# Patient Record
Sex: Female | Born: 1960 | Hispanic: No | Marital: Single | Smoking: Never smoker
Health system: Southern US, Community
[De-identification: ages and names within clinical notes are randomized; demographics above are authoritative.]

## PROBLEM LIST (undated history)

## (undated) DIAGNOSIS — F32A Depression, unspecified: Secondary | ICD-10-CM

## (undated) DIAGNOSIS — J4520 Mild intermittent asthma, uncomplicated: Secondary | ICD-10-CM

## (undated) DIAGNOSIS — F419 Anxiety disorder, unspecified: Secondary | ICD-10-CM

## (undated) DIAGNOSIS — E785 Hyperlipidemia, unspecified: Secondary | ICD-10-CM

## (undated) DIAGNOSIS — F329 Major depressive disorder, single episode, unspecified: Secondary | ICD-10-CM

## (undated) HISTORY — PX: DILATION AND CURETTAGE OF UTERUS: SHX78

## (undated) HISTORY — DX: Mild intermittent asthma, uncomplicated: J45.20

## (undated) HISTORY — DX: Hyperlipidemia, unspecified: E78.5

---

## 1990-11-18 HISTORY — PX: CHOLECYSTECTOMY: SHX55

## 1998-11-18 HISTORY — PX: ECTOPIC PREGNANCY SURGERY: SHX613

## 2003-07-20 ENCOUNTER — Other Ambulatory Visit: Admission: RE | Admit: 2003-07-20 | Discharge: 2003-07-20 | Payer: Self-pay | Admitting: *Deleted

## 2013-02-22 ENCOUNTER — Emergency Department (HOSPITAL_BASED_OUTPATIENT_CLINIC_OR_DEPARTMENT_OTHER)
Admission: EM | Admit: 2013-02-22 | Discharge: 2013-02-22 | Disposition: A | Discharge status: Home / Self Care | Payer: Medicaid Other | Attending: Emergency Medicine | Admitting: Emergency Medicine

## 2013-02-22 ENCOUNTER — Emergency Department (HOSPITAL_BASED_OUTPATIENT_CLINIC_OR_DEPARTMENT_OTHER): Payer: Medicaid Other

## 2013-02-22 ENCOUNTER — Encounter (HOSPITAL_BASED_OUTPATIENT_CLINIC_OR_DEPARTMENT_OTHER): Payer: Self-pay | Admitting: *Deleted

## 2013-02-22 DIAGNOSIS — S61452A Open bite of left hand, initial encounter: Secondary | ICD-10-CM

## 2013-02-22 DIAGNOSIS — Z8659 Personal history of other mental and behavioral disorders: Secondary | ICD-10-CM | POA: Insufficient documentation

## 2013-02-22 DIAGNOSIS — S61409A Unspecified open wound of unspecified hand, initial encounter: Secondary | ICD-10-CM | POA: Insufficient documentation

## 2013-02-22 DIAGNOSIS — S0003XA Contusion of scalp, initial encounter: Secondary | ICD-10-CM | POA: Insufficient documentation

## 2013-02-22 HISTORY — DX: Depression, unspecified: F32.A

## 2013-02-22 HISTORY — DX: Anxiety disorder, unspecified: F41.9

## 2013-02-22 HISTORY — DX: Major depressive disorder, single episode, unspecified: F32.9

## 2013-02-22 LAB — CBC WITH DIFFERENTIAL/PLATELET
Basophils Absolute: 0 10*3/uL (ref 0.0–0.1)
Eosinophils Absolute: 0.2 10*3/uL (ref 0.0–0.7)
Eosinophils Relative: 2 % (ref 0–5)
MCH: 31.6 pg (ref 26.0–34.0)
MCHC: 34.2 g/dL (ref 30.0–36.0)
MCV: 92.5 fL (ref 78.0–100.0)
Platelets: 316 10*3/uL (ref 150–400)
RDW: 13.5 % (ref 11.5–15.5)

## 2013-02-22 MED ORDER — OXYCODONE-ACETAMINOPHEN 5-325 MG PO TABS
ORAL_TABLET | ORAL | Status: AC
  Administered 2013-02-22: 1 via ORAL
  Filled 2013-02-22: qty 1

## 2013-02-22 MED ORDER — AMPICILLIN-SULBACTAM SODIUM 3 (2-1) G IJ SOLR
3.0000 g | Freq: Once | INTRAMUSCULAR | Status: AC
  Administered 2013-02-22: 3 g via INTRAVENOUS
  Filled 2013-02-22: qty 3

## 2013-02-22 MED ORDER — ACETAMINOPHEN 325 MG PO TABS
650.0000 mg | ORAL_TABLET | Freq: Once | ORAL | Status: AC
  Administered 2013-02-22: 650 mg via ORAL
  Filled 2013-02-22: qty 2

## 2013-02-22 MED ORDER — AMOXICILLIN-POT CLAVULANATE 875-125 MG PO TABS: 1.0000 | ORAL_TABLET | Freq: Two times a day (BID) | ORAL | Status: DC

## 2013-02-22 MED ORDER — OXYCODONE-ACETAMINOPHEN 5-325 MG PO TABS
1.0000 | ORAL_TABLET | Freq: Once | ORAL | Status: AC
  Filled 2013-02-22: qty 1

## 2013-02-22 MED ORDER — OXYCODONE-ACETAMINOPHEN 5-325 MG PO TABS: 1.0000 | ORAL_TABLET | Freq: Four times a day (QID) | ORAL | Status: DC | PRN

## 2013-02-22 MED ORDER — CLINDAMYCIN PHOSPHATE 600 MG/50ML IV SOLN
600.0000 mg | Freq: Once | INTRAVENOUS | Status: AC
  Administered 2013-02-22: 600 mg via INTRAVENOUS
  Filled 2013-02-22: qty 50
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
## 2013-02-22 NOTE — ED Notes (Signed)
Patient transported to & from xray. 

## 2013-04-24 DIAGNOSIS — K219 Gastro-esophageal reflux disease without esophagitis: Secondary | ICD-10-CM | POA: Insufficient documentation

## 2013-04-24 DIAGNOSIS — F332 Major depressive disorder, recurrent severe without psychotic features: Secondary | ICD-10-CM | POA: Insufficient documentation

## 2014-05-19 DIAGNOSIS — G43909 Migraine, unspecified, not intractable, without status migrainosus: Secondary | ICD-10-CM | POA: Insufficient documentation

## 2014-06-06 ENCOUNTER — Emergency Department (HOSPITAL_BASED_OUTPATIENT_CLINIC_OR_DEPARTMENT_OTHER)
Admission: EM | Admit: 2014-06-06 | Discharge: 2014-06-07 | Disposition: A | Discharge status: Home / Self Care | Payer: Medicaid Other | Attending: Emergency Medicine | Admitting: Emergency Medicine

## 2014-06-06 ENCOUNTER — Encounter (HOSPITAL_BASED_OUTPATIENT_CLINIC_OR_DEPARTMENT_OTHER): Payer: Self-pay | Admitting: Emergency Medicine

## 2014-06-06 DIAGNOSIS — T7804XA Anaphylactic reaction due to fruits and vegetables, initial encounter: Secondary | ICD-10-CM | POA: Insufficient documentation

## 2014-06-06 DIAGNOSIS — R079 Chest pain, unspecified: Secondary | ICD-10-CM | POA: Insufficient documentation

## 2014-06-06 DIAGNOSIS — R221 Localized swelling, mass and lump, neck: Secondary | ICD-10-CM

## 2014-06-06 DIAGNOSIS — Z792 Long term (current) use of antibiotics: Secondary | ICD-10-CM | POA: Insufficient documentation

## 2014-06-06 DIAGNOSIS — R42 Dizziness and giddiness: Secondary | ICD-10-CM | POA: Insufficient documentation

## 2014-06-06 DIAGNOSIS — R220 Localized swelling, mass and lump, head: Secondary | ICD-10-CM | POA: Insufficient documentation

## 2014-06-06 DIAGNOSIS — T782XXA Anaphylactic shock, unspecified, initial encounter: Secondary | ICD-10-CM

## 2014-06-06 DIAGNOSIS — T621X1A Toxic effect of ingested berries, accidental (unintentional), initial encounter: Secondary | ICD-10-CM | POA: Insufficient documentation

## 2014-06-06 DIAGNOSIS — Y9289 Other specified places as the place of occurrence of the external cause: Secondary | ICD-10-CM | POA: Insufficient documentation

## 2014-06-06 DIAGNOSIS — L299 Pruritus, unspecified: Secondary | ICD-10-CM | POA: Insufficient documentation

## 2014-06-06 DIAGNOSIS — F411 Generalized anxiety disorder: Secondary | ICD-10-CM | POA: Insufficient documentation

## 2014-06-06 DIAGNOSIS — Y9389 Activity, other specified: Secondary | ICD-10-CM | POA: Insufficient documentation

## 2014-06-06 DIAGNOSIS — L500 Allergic urticaria: Secondary | ICD-10-CM | POA: Insufficient documentation

## 2014-06-06 DIAGNOSIS — T7840XA Allergy, unspecified, initial encounter: Secondary | ICD-10-CM

## 2014-06-06 DIAGNOSIS — T622X1A Toxic effect of other ingested (parts of) plant(s), accidental (unintentional), initial encounter: Secondary | ICD-10-CM | POA: Insufficient documentation

## 2014-06-06 MED ORDER — FAMOTIDINE IN NACL 20-0.9 MG/50ML-% IV SOLN
INTRAVENOUS | Status: AC
  Administered 2014-06-06: 20 mg
  Filled 2014-06-06: qty 50

## 2014-06-06 MED ORDER — DIPHENHYDRAMINE HCL 25 MG PO TABS: 50.0000 mg | ORAL_TABLET | Freq: Four times a day (QID) | ORAL | Status: DC

## 2014-06-06 MED ORDER — PREDNISONE 50 MG PO TABS: 50.0000 mg | ORAL_TABLET | Freq: Every day | ORAL | Status: DC

## 2014-06-06 MED ORDER — METHYLPREDNISOLONE SODIUM SUCC 125 MG IJ SOLR
INTRAMUSCULAR | Status: AC
  Administered 2014-06-06: 125 mg
  Filled 2014-06-06: qty 2

## 2014-06-06 MED ORDER — SODIUM CHLORIDE 0.9 % IV BOLUS (SEPSIS)
1000.0000 mL | Freq: Once | INTRAVENOUS | Status: AC
  Administered 2014-06-06: 1000 mL via INTRAVENOUS

## 2014-06-06 MED ORDER — DIPHENHYDRAMINE HCL 50 MG/ML IJ SOLN
INTRAMUSCULAR | Status: AC
  Administered 2014-06-06: 25 mg
  Filled 2014-06-06: qty 1

## 2014-06-06 MED ORDER — EPINEPHRINE HCL 1 MG/ML IJ SOLN
INTRAMUSCULAR | Status: AC
  Administered 2014-06-06: 0.3 mg
  Filled 2014-06-06: qty 1

## 2014-06-06 NOTE — ED Notes (Signed)
Was eating cherries and felt swelling inside her mouth and then her throat felt like it was swelling. Swelling to her uvula.

## 2014-06-06 NOTE — ED Notes (Signed)
Famotidine Pepcid is infusing at present time.

## 2014-06-06 NOTE — Discharge Instructions (Signed)
Return to the ED with any concerns including difficulty breathing, lip or tongue swelling, vomiting, fainting, decreased level of alertness/lethargy, or any other alarming symptoms °

## 2014-06-06 NOTE — ED Provider Notes (Signed)
CSN: [id number]     Arrival date & time 06/06/14  2149 History  This chart was scribed for Robin Chick, MD by Nicholos Johns, ED scribe. This patient was seen in room MH10/MH10 and the patient's care was started at 9:58 PM.    Chief Complaint  Patient presents with  . Allergic Reaction   Patient is a 53 y.o. female presenting with allergic reaction. The history is provided by the patient. No language interpreter was used.  Allergic Reaction Presenting symptoms: itching and swelling   Presenting symptoms: no difficulty breathing and no difficulty swallowing   Itching:    Location:  Lips, arm and chest   Severity:  Moderate   Onset quality:  Gradual   Duration: 30 minutes.   Timing:  Constant   Progression:  Worsening Swelling:    Location:  Mouth   Onset quality:  Gradual   Duration: 30 minutes.   Timing:  Constant   Progression:  Worsening   Chronicity:  New Severity:  Moderate Prior allergic episodes:  No prior episodes Context: food   Relieved by:  Nothing Worsened by:  Nothing tried Ineffective treatments:  None tried  HPI Comments: Robin Trevino is a 53 y.o. female who presents to the Emergency Department for possible allergic reaction; onset 30 minutes ago. Denies trouble breathing. States she was eating cherries and felt swelling inside her mouth and throat within 5-10 minutes after eating. Has eaten cherries before with no reaction. Also reports itching inside her lips and along her upper chest and arms. Currently notes some substernal chest pain and dizziness. Has not taken any OTC medication to treat. No known allergy to cherries.   Past Medical History  Diagnosis Date  . Anxiety   . Depression    History reviewed. No pertinent past surgical history. No family history on file. History  Substance Use Topics  . Smoking status: Never Smoker   . Smokeless tobacco: Not on file  . Alcohol Use: Yes     Comment: occ   OB History   Grav Para Term Preterm  Abortions TAB SAB Ect Mult Living                 Review of Systems  HENT: Negative for trouble swallowing.   Respiratory: Negative for shortness of breath.   Cardiovascular: Positive for chest pain.  Skin: Positive for itching.  Neurological: Positive for dizziness.  All other systems reviewed and are negative.  Allergies  Donnatal  Home Medications   Prior to Admission medications   Medication Sig Start Date End Date Taking? Authorizing Provider  amoxicillin-clavulanate (AUGMENTIN) 875-125 MG per tablet Take 1 tablet by mouth 2 (two) times daily. 02/22/13   Juliet Rude. Pickering, MD  oxyCODONE-acetaminophen (PERCOCET/ROXICET) 5-325 MG per tablet Take 1-2 tablets by mouth every 6 (six) hours as needed for pain. 02/22/13   Juliet Rude. Rubin Payor, MD   Triage vitals: BP 119/83  Pulse 101  Temp(Src) 98.6 F (37 C) (Oral)  Resp 20  Ht 5\' 4"  (1.626 m)  Wt 133 lb (60.328 kg)  BMI 22.82 kg/m2  SpO2 98%  LMP 06/01/2014  Physical Exam  Nursing note and vitals reviewed. Constitutional: She is oriented to person, place, and time. She appears well-developed and well-nourished. No distress.  Anxious appearing  HENT:  Head: Normocephalic and atraumatic.  Mouth/Throat: Uvula swelling present. Posterior oropharyngeal erythema present.  Mild lip swelling. No tongue swelling.   Eyes: Conjunctivae and EOM are normal.  Neck: Neck supple. No  tracheal deviation present.  Cardiovascular: Normal rate.   Pulmonary/Chest: Effort normal. No respiratory distress.  No SOB.  Musculoskeletal: Normal range of motion.  Neurological: She is alert and oriented to person, place, and time.  Skin: Skin is warm and dry.  Hives on upper chest.  Psychiatric: She has a normal mood and affect. Her behavior is normal.    ED Course  Procedures (including critical care time) DIAGNOSTIC STUDIES: Oxygen Saturation is 98% on room air, normal by my interpretation.    COORDINATION OF CARE: At 10:02 PM: Discussed  treatment plan with patient which includes IV fluids to reverse sxs and 4-6 hour observation. Patient agrees.    CRITICAL CARE Performed by: Robin ChickLINKER,MARTHA K Total critical care time: 35 Critical care time was exclusive of separately billable procedures and treating other patients. Critical care was necessary to treat or prevent imminent or life-threatening deterioration. Critical care was time spent personally by me on the following activities: development of treatment plan with patient and/or surrogate as well as nursing, discussions with consultants, evaluation of patient's response to treatment, examination of patient, obtaining history from patient or surrogate, ordering and performing treatments and interventions, ordering and review of laboratory studies, ordering and review of radiographic studies, pulse oximetry and re-evaluation of patient's condition.  10:53 PM pt rechecked and is feeling much improved. Has had complete resolution of her symptoms.   Labs Review Labs Reviewed - No data to display  Imaging Review No results found.   EKG Interpretation None      MDM   Final diagnoses:  None  pt rpesenting with lip swelling, throat swelling and tongue itching with some shortness of breath- presents approx 30 minutes after eating cherries.  Pt treated with epinephrine, benadryl, solumedrol, pepcid.  She has had complete resolution of her allergic symptoms.  Pt will need to be observed x 4 hours until 2am.  Care signed out to Dr. Read DriversMolpus at end of shift.  Will d/c with epipen, to take benadryl for the next 2-3 days and complete steroid course.    I personally performed the services described in this documentation, which was scribed in my presence. The recorded information has been reviewed and is accurate.   1:45AM Vitals stable. No sign of anaphylaxis; will D/C home.   Hanley SeamenJohn L Kenwood Rosiak, MD 06/07/14 [phone]

## 2014-06-07 NOTE — ED Notes (Signed)
Pt. Resting with eyes closed.  Pt. Has no s/s of resp. Distress.   Pt. Has no drooling noted and no trouble swallowing.

## 2015-02-16 DIAGNOSIS — E785 Hyperlipidemia, unspecified: Secondary | ICD-10-CM | POA: Insufficient documentation

## 2015-08-29 ENCOUNTER — Encounter: Payer: Self-pay | Admitting: Pediatrics

## 2015-08-29 ENCOUNTER — Ambulatory Visit (INDEPENDENT_AMBULATORY_CARE_PROVIDER_SITE_OTHER): Payer: BLUE CROSS/BLUE SHIELD | Admitting: Pediatrics

## 2015-08-29 VITALS — BP 124/76 | HR 80 | Temp 98.1°F | Resp 16 | Ht 65.0 in | Wt 141.0 lb

## 2015-08-29 DIAGNOSIS — J4520 Mild intermittent asthma, uncomplicated: Secondary | ICD-10-CM

## 2015-08-29 DIAGNOSIS — T7800XA Anaphylactic reaction due to unspecified food, initial encounter: Secondary | ICD-10-CM | POA: Insufficient documentation

## 2015-08-29 DIAGNOSIS — J301 Allergic rhinitis due to pollen: Secondary | ICD-10-CM

## 2015-08-29 DIAGNOSIS — T781XXA Other adverse food reactions, not elsewhere classified, initial encounter: Secondary | ICD-10-CM | POA: Diagnosis not present

## 2015-08-29 HISTORY — DX: Mild intermittent asthma, uncomplicated: J45.20

## 2015-08-29 MED ORDER — EPINEPHRINE 0.3 MG/0.3ML IJ SOAJ: 0.3000 mg | Freq: Once | INTRAMUSCULAR | Status: AC

## 2015-08-29 MED ORDER — ALBUTEROL SULFATE 108 (90 BASE) MCG/ACT IN AEPB: 2.0000 | INHALATION_SPRAY | Freq: Four times a day (QID) | RESPIRATORY_TRACT | Status: AC

## 2015-08-29 NOTE — Patient Instructions (Signed)
Environmental control of dust mite. She will avoid pears, apples, cherries, pineapples ,almond and hazelnut. If she has an allergic reaction she will take Benadryl-diphenhydramine 50 mg every 4 hours. If she has life-threatening symptoms she will inject herself with EpiPen. She was given a list of foods associated with the oral allergy syndrome for information. Cetirizine 10 mg once a day if needed for runny nose or itching. Nasacort 2 sprays per nostril once a day if needed for stuffy nose. Pro-air 2 puffs every 4 hours if needed for cough or wheeze. She may use pro-air 2 puffs before exercise. Follow-up with her allergic symptoms are not under control. If she has more allergic reaction she will let us know.

## 2015-08-29 NOTE — Progress Notes (Signed)
NEW PATIENT NOTE  RE: Robin Trevino MRN: [id number] DOB: Feb 09, 1961 [facility] [place] [address] [place] [address] Date of Office Visit: 08/29/2015  Assessment Referring provider: Delilah Shan, MD [address] [facility] [place], [place] [address]  Chief Complaint: Food Intolerance   HPI In August of this year, the patient ate an Asian pear and developed an itching swollen throat and was seen in the emergency room.. They kept her overnight to treat this reaction. Last year she had similar symptoms after eating cherries. She has a history of allergic rhinitis since childhood aggravated by exposure to dust ,cigarette smoke, cats and the spring time. She has coughing spells in the springtime. She has some coughing and shortness of breath with exercise. She has had itching of her mouth from apples, pineapple and almond.    Review of Systems  Constitutional: Negative.   HENT: Positive for congestion (in the spring and fall of the year).   Eyes: Negative.   Respiratory: Positive for wheezing (occasionally with exercise).   Cardiovascular: Negative.   Gastrointestinal: Negative.   Genitourinary: Negative.   Musculoskeletal: Negative.   Skin: Negative.   Neurological: Negative.   Endo/Heme/Allergies: Positive for environmental allergies.  Psychiatric/Behavioral: Negative.      Drug Allergies:  Allergies  Allergen Reactions  . Amoxicillin Diarrhea  . Donnatal [Belladonna Alk-Phenobarb Er]   . Lamotrigine   . Pb-Hyoscy-Atropine-Scopol Er     Physical Exam: BP 124/76 mmHg  Pulse 80  Temp(Src) 98.1 F (36.7 C) (Oral)  Resp 16  Ht _0  (1.651 m)  Wt 141 lb (63.957 kg)  BMI 23.46 kg/m2  Physical Exam  Constitutional: She is oriented to person, place, and time. She appears well-developed and well-nourished.  HENT:  Eyes normal. Ears normal. Nose mild swelling of the nasal  turbinates. Pharynx normal  Neck: Neck supple. No thyromegaly present.  Cardiovascular:  Heart S1-S2 normal, no murmurs.  Pulmonary/Chest:  Chest clear to percussion and auscultation  Abdominal:  Abdomen normal. No organ enlargement  Lymphadenopathy:    She has no cervical adenopathy.  Neurological: She is alert and oriented to person, place, and time.  Skin:  Clear     Diagnostics:  Allergy skin tests were very positive to grass pollen, weed pollen, tree pollen, dust mite, Apple, almond and hazelnut. Forced vital capacity 2.93 L FEV1 2.58 L. Predicted forced hot capacity 3.45 L predicted FEV1 2.77 L. After albuterol 2 puffs the forced vital capacity became 2.99 L and the FEV1 2.69 L-the spirometry is in the normal range and there was no significant improvement after albuterol    Assessment and Plan: 1. Allergy with anaphylaxis due to food, initial encounter   2. Allergic rhinitis due to pollen   3. Mild intermittent asthma, uncomplicated   4. Oral allergy syndrome, initial encounter   5. Seasonal allergic rhinitis due to pollen    Meds ordered this encounter  Medications  . Albuterol Sulfate (PROAIR RESPICLICK) 453 (90 BASE) MCG/ACT AEPB    Sig: Inhale 2 puffs into the lungs every 6 (six) hours.    Dispense:  1 each    Refill:  0  . EPINEPHrine (EPIPEN 2-PAK) 0.3 mg/0.3 mL IJ SOAJ injection    Sig: Inject 0.3 mLs (0.3 mg total) into the muscle once.    Dispense:  2 Device    Refill:  1   Patient Instructions  Environmental control  of dust mite. She will avoid pears, apples, cherries, pineapples ,almond and hazelnut. If she has an allergic reaction she will take Benadryl-diphenhydramine 50 mg every 4 hours. If she has life-threatening symptoms she will inject herself with EpiPen. She was given a list of foods associated with the oral allergy syndrome for information. Cetirizine 10 mg once a day if needed for runny nose or itching. Nasacort 2 sprays per nostril once a day  if needed for stuffy nose. Pro-air 2 puffs every 4 hours if needed for cough or wheeze. She may use pro-air 2 puffs before exercise. Follow-up with her allergic symptoms are not under control. If she has more allergic reaction she will let us know.   Return if symptoms worsen or fail to improve.   Thank you for the opportunity to care for this patient.  Please do not hesitate to contact me with questions.  [facility] [address] [place], [place] [address] [phone]  J. Charyl Bigger, M.D.

## 2015-08-30 DIAGNOSIS — T781XXA Other adverse food reactions, not elsewhere classified, initial encounter: Secondary | ICD-10-CM | POA: Insufficient documentation

## 2016-09-16 ENCOUNTER — Encounter (HOSPITAL_BASED_OUTPATIENT_CLINIC_OR_DEPARTMENT_OTHER): Payer: Self-pay | Admitting: *Deleted

## 2016-09-16 ENCOUNTER — Emergency Department (HOSPITAL_BASED_OUTPATIENT_CLINIC_OR_DEPARTMENT_OTHER)
Admission: EM | Admit: 2016-09-16 | Discharge: 2016-09-17 | Disposition: A | Discharge status: Home / Self Care | Payer: Medicaid Other | Attending: Emergency Medicine | Admitting: Emergency Medicine

## 2016-09-16 DIAGNOSIS — R1013 Epigastric pain: Secondary | ICD-10-CM | POA: Insufficient documentation

## 2016-09-16 DIAGNOSIS — J4520 Mild intermittent asthma, uncomplicated: Secondary | ICD-10-CM | POA: Insufficient documentation

## 2016-09-16 DIAGNOSIS — Z7951 Long term (current) use of inhaled steroids: Secondary | ICD-10-CM | POA: Insufficient documentation

## 2016-09-16 MED ORDER — ONDANSETRON HCL 4 MG/2ML IJ SOLN
4.0000 mg | Freq: Once | INTRAMUSCULAR | Status: DC
  Filled 2016-09-16: qty 2

## 2016-09-16 MED ORDER — FENTANYL CITRATE (PF) 100 MCG/2ML IJ SOLN
100.0000 ug | Freq: Once | INTRAMUSCULAR | Status: DC
Start: 2016-09-17 — End: 2016-09-17
  Filled 2016-09-16: qty 2

## 2016-09-16 MED ORDER — SODIUM CHLORIDE 0.9 % IV SOLN: Freq: Once | INTRAVENOUS | Status: DC

## 2016-09-16 NOTE — ED Notes (Signed)
MD at bedside. 

## 2016-09-16 NOTE — ED Provider Notes (Addendum)
[facility] Provider Note: Robin Spurling, MD, FACEP  CSN: [id number] MRN: [id number] ARRIVAL: 09/16/16 at [place]: MH10/MH10  By signing my name below, I, Georgette Shell, attest that this documentation has been prepared under the direction and in the presence of Shanon Rosser, MD. Electronically Signed: Georgette Shell, ED Scribe. 09/16/16. 11:53 PM.  CHIEF COMPLAINT  Abdominal Pain   HISTORY OF PRESENT ILLNESS  HPI Comments: Robin Trevino is a 55 y.o. female who presents to the Emergency Department complaining of epigastric abdominal pain radiating to her left shoulder and back onset 2 hours ago while eating. Pt rates her current pain a 8/10. Pt also has associated nausea but no vomiting. Pain is exacerbated with deep breathing or palpation. Pt has taken Pepto Bismol with no relief. Pt further notes that she has had diarrhea for the past couple of days. Pt denies fever or blood in her stool.  Past Medical History:  Diagnosis Date  . Anxiety   . Depression   . Mild intermittent asthma 08/29/2015    Past Surgical History:  Procedure Laterality Date  . CESAREAN SECTION  1996  . CHOLECYSTECTOMY Bilateral 1992  . [place]  . ECTOPIC PREGNANCY SURGERY  2000    Family History  Problem Relation Age of Onset  . Asthma Son   . Asthma Mother   . Allergic rhinitis Mother   . Asthma Brother     Social History  Substance Use Topics  . Smoking status: Never Smoker  . Smokeless tobacco: Not on file  . Alcohol use Yes     Comment: occ    Prior to Admission medications   Medication Sig Start Date End Date Taking? Authorizing Provider  Albuterol Sulfate (PROAIR RESPICLICK) 294 (90 BASE) MCG/ACT AEPB Inhale 2 puffs into the lungs every 6 (six) hours. 08/29/15   Charlies Silvers, MD  BIOTIN PO Take 500 mg by mouth daily.    Historical Provider, MD  diphenhydrAMINE (BENADRYL) 25 MG tablet Take 2 tablets (50 mg total) by mouth every 6 (six)  hours. Take 1-2 tablets every 6 hours x 2 days, then space out to an as needed basis 06/06/14   Alfonzo Beers, MD  EPINEPHrine (EPIPEN 2-PAK) 0.3 mg/0.3 mL IJ SOAJ injection Inject 0.3 mLs (0.3 mg total) into the muscle once. 08/29/15   Charlies Silvers, MD  ferrous sulfate 325 (65 FE) MG tablet Take 325 mg by mouth daily.    Historical Provider, MD  omeprazole (PRILOSEC) 20 MG capsule Take one capsule daily at least 30 minutes before first dose of Carafate. 09/17/16   Brick Ketcher, MD  sucralfate (CARAFATE) 1 g tablet Take 1 tablet (1 g total) by mouth 4 (four) times daily -  with meals and at bedtime. 09/17/16   Brailynn Breth, MD  triamcinolone (NASACORT) 55 MCG/ACT AERO nasal inhaler Place 1 spray into the nose as needed.    Historical Provider, MD  valACYclovir (VALTREX) 500 MG tablet Take 500 mg by mouth as needed. 11/27/11   Historical Provider, MD    Allergies Amoxicillin; Donnatal [belladonna alk-phenobarb er]; Lamotrigine; and Pb-hyoscy-atropine-scopol er   REVIEW OF SYSTEMS  Negative except as noted here or in the History of Present Illness.   PHYSICAL EXAMINATION  Initial Vital Signs Blood pressure 132/91, pulse 72, temperature 97.9 F (36.6 C), resp. rate 16, height 5' 4"  (1.626 m), weight 140 lb (63.5 kg), last menstrual period 06/01/2014, SpO2 98 %.  Examination General: Well-developed,  well-nourished female in no acute distress; appearance consistent with age of record HENT: normocephalic; atraumatic Eyes: pupils equal, round and reactive to light; extraocular muscles intact Neck: supple Heart: regular rate and rhythm Lungs: clear to auscultation bilaterally; frequent dry cough  Abdomen: soft; nondistended; epigastric tenderness; no masses or hepatosplenomegaly; bowel sounds present Extremities: No deformity; full range of motion Neurologic: Awake, alert and oriented; motor function intact in all extremities and symmetric; no facial droop Skin: Warm and dry Psychiatric:  Normal mood and affect   RESULTS  Summary of this visit's results, reviewed by myself:   EKG Interpretation  Date/Time:  Monday September 16 2016 23:50:00 EDT Ventricular Rate:  84 PR Interval:    QRS Duration: 101 QT Interval:  374 QTC Calculation: 443 R Axis:   75 Text Interpretation:  Sinus rhythm Normal ECG No previous ECGs available Confirmed by Jisel Fleet  MD, Jenny Reichmann ([id number]) on 09/16/2016 11:54:55 PM      Laboratory Studies: Results for orders placed or performed during the hospital encounter of 09/16/16 (from the past 24 hour(s))  CBC with Differential/Platelet     Status: Abnormal   Collection Time: 09/17/16 12:05 AM  Result Value Ref Range   WBC 6.2 4.0 - 10.5 K/uL   RBC 3.65 (L) 3.87 - 5.11 MIL/uL   Hemoglobin 11.6 (L) 12.0 - 15.0 g/dL   HCT 35.2 (L) 36.0 - 46.0 %   MCV 96.4 78.0 - 100.0 fL   MCH 31.8 26.0 - 34.0 pg   MCHC 33.0 30.0 - 36.0 g/dL   RDW 12.5 11.5 - 15.5 %   Platelets 286 150 - 400 K/uL   Neutrophils Relative % 45 %   Neutro Abs 2.8 1.7 - 7.7 K/uL   Lymphocytes Relative 41 %   Lymphs Abs 2.5 0.7 - 4.0 K/uL   Monocytes Relative 11 %   Monocytes Absolute 0.7 0.1 - 1.0 K/uL   Eosinophils Relative 3 %   Eosinophils Absolute 0.2 0.0 - 0.7 K/uL   Basophils Relative 0 %   Basophils Absolute 0.0 0.0 - 0.1 K/uL  Comprehensive metabolic panel     Status: Abnormal   Collection Time: 09/17/16 12:05 AM  Result Value Ref Range   Sodium 136 135 - 145 mmol/L   Potassium 3.9 3.5 - 5.1 mmol/L   Chloride 106 101 - 111 mmol/L   CO2 26 22 - 32 mmol/L   Glucose, Bld 103 (H) 65 - 99 mg/dL   BUN 19 6 - 20 mg/dL   Creatinine, Ser 0.69 0.44 - 1.00 mg/dL   Calcium 8.8 (L) 8.9 - 10.3 mg/dL   Total Protein 7.1 6.5 - 8.1 g/dL   Albumin 3.6 3.5 - 5.0 g/dL   AST 19 15 - 41 U/L   ALT 14 14 - 54 U/L   Alkaline Phosphatase 66 38 - 126 U/L   Total Bilirubin 0.3 0.3 - 1.2 mg/dL   GFR calc non Af Amer >60 >60 mL/min   GFR calc Af Amer >60 >60 mL/min   Anion gap 4 (L) 5 - 15    Lipase, blood     Status: None   Collection Time: 09/17/16 12:05 AM  Result Value Ref Range   Lipase 29 11 - 51 U/L   Imaging Studies: No results found.  ED COURSE  Nursing notes and initial vitals signs, including pulse oximetry, reviewed.  Vitals:   09/16/16 2342 09/16/16 2343 09/17/16 0052  BP:  132/91 118/79  Pulse:  72 69  Resp:  16  16  Temp:  97.9 F (36.6 C)   SpO2:  98% 98%  Weight: 140 lb (63.5 kg)    Height: 5' 4"  (1.626 m)     12:14 AM Patient is refusing an IV and refuses CT scan. She does consent to blood draw.  12:54 AM Pain down to a 4 out of 10 after Carafate. Suspect gastritis and we'll treat with omeprazole and Carafate.  PROCEDURES    ED DIAGNOSES     ICD-9-CM ICD-10-CM   1. Epigastric pain 789.06 R10.13     I personally performed the services described in this documentation, which was scribed in my presence. The recorded information has been reviewed and is accurate.     Shanon Rosser, MD 09/17/16 4255    Shanon Rosser, MD 09/17/16 0100

## 2016-09-16 NOTE — ED Triage Notes (Signed)
Pt c/o epigastric pain which radiates to left  shoulder and back  X 2 hrs

## 2016-09-17 LAB — CBC WITH DIFFERENTIAL/PLATELET
Basophils Absolute: 0 10*3/uL (ref 0.0–0.1)
Basophils Relative: 0 %
EOS ABS: 0.2 10*3/uL (ref 0.0–0.7)
EOS PCT: 3 %
HCT: 35.2 % — ABNORMAL LOW (ref 36.0–46.0)
Hemoglobin: 11.6 g/dL — ABNORMAL LOW (ref 12.0–15.0)
LYMPHS ABS: 2.5 10*3/uL (ref 0.7–4.0)
Lymphocytes Relative: 41 %
MCH: 31.8 pg (ref 26.0–34.0)
MCHC: 33 g/dL (ref 30.0–36.0)
MCV: 96.4 fL (ref 78.0–100.0)
Monocytes Absolute: 0.7 10*3/uL (ref 0.1–1.0)
Monocytes Relative: 11 %
Neutro Abs: 2.8 10*3/uL (ref 1.7–7.7)
Neutrophils Relative %: 45 %
PLATELETS: 286 10*3/uL (ref 150–400)
RBC: 3.65 MIL/uL — AB (ref 3.87–5.11)
RDW: 12.5 % (ref 11.5–15.5)
WBC: 6.2 10*3/uL (ref 4.0–10.5)

## 2016-09-17 LAB — COMPREHENSIVE METABOLIC PANEL
ALT: 14 U/L (ref 14–54)
ANION GAP: 4 — AB (ref 5–15)
AST: 19 U/L (ref 15–41)
Albumin: 3.6 g/dL (ref 3.5–5.0)
Alkaline Phosphatase: 66 U/L (ref 38–126)
BUN: 19 mg/dL (ref 6–20)
CHLORIDE: 106 mmol/L (ref 101–111)
CO2: 26 mmol/L (ref 22–32)
Calcium: 8.8 mg/dL — ABNORMAL LOW (ref 8.9–10.3)
Creatinine, Ser: 0.69 mg/dL (ref 0.44–1.00)
GFR calc non Af Amer: >60 mL/min (ref >60)
Glucose, Bld: 103 mg/dL — ABNORMAL HIGH (ref 65–99)
Potassium: 3.9 mmol/L (ref 3.5–5.1)
SODIUM: 136 mmol/L (ref 135–145)
Total Bilirubin: 0.3 mg/dL (ref 0.3–1.2)
Total Protein: 7.1 g/dL (ref 6.5–8.1)

## 2016-09-17 LAB — LIPASE, BLOOD: Lipase: 29 U/L (ref 11–51)

## 2016-09-17 MED ORDER — SUCRALFATE 1 G PO TABS
1.0000 g | ORAL_TABLET | Freq: Once | ORAL | Status: AC
  Administered 2016-09-17: 1 g via ORAL
  Filled 2016-09-17: qty 1

## 2016-09-17 MED ORDER — SUCRALFATE 1 G PO TABS: 1.0000 g | ORAL_TABLET | Freq: Three times a day (TID) | ORAL | 0 refills | Status: DC

## 2016-09-17 MED ORDER — OMEPRAZOLE 20 MG PO CPDR: DELAYED_RELEASE_CAPSULE | ORAL | 0 refills | Status: DC

## 2016-09-17 NOTE — ED Notes (Signed)
Pt has refused to have an IV, stating that it is too expensive.  MD notified.  Pt allowed blood draw.  Sts she will not have a CT scan if it is ordered by the edp but will follow up with her pmd tomorrow.

## 2017-01-22 LAB — HM HEPATITIS C SCREENING LAB: HM Hepatitis Screen: NEGATIVE

## 2017-02-11 DIAGNOSIS — K439 Ventral hernia without obstruction or gangrene: Secondary | ICD-10-CM | POA: Insufficient documentation

## 2018-03-05 LAB — HIV ANTIBODY (ROUTINE TESTING W REFLEX): HIV 1&2 Ab, 4th Generation: NONREACTIVE

## 2019-09-17 LAB — HM PAP SMEAR: HM Pap smear: NORMAL

## 2020-02-05 ENCOUNTER — Emergency Department (HOSPITAL_BASED_OUTPATIENT_CLINIC_OR_DEPARTMENT_OTHER): Payer: Self-pay

## 2020-02-05 ENCOUNTER — Other Ambulatory Visit: Payer: Self-pay

## 2020-02-05 ENCOUNTER — Emergency Department (HOSPITAL_BASED_OUTPATIENT_CLINIC_OR_DEPARTMENT_OTHER)
Admission: EM | Admit: 2020-02-05 | Discharge: 2020-02-05 | Disposition: A | Discharge status: Home / Self Care | Payer: Self-pay | Attending: Emergency Medicine | Admitting: Emergency Medicine

## 2020-02-05 ENCOUNTER — Encounter (HOSPITAL_BASED_OUTPATIENT_CLINIC_OR_DEPARTMENT_OTHER): Payer: Self-pay

## 2020-02-05 DIAGNOSIS — A084 Viral intestinal infection, unspecified: Secondary | ICD-10-CM | POA: Insufficient documentation

## 2020-02-05 DIAGNOSIS — R1033 Periumbilical pain: Secondary | ICD-10-CM | POA: Insufficient documentation

## 2020-02-05 DIAGNOSIS — Z20822 Contact with and (suspected) exposure to covid-19: Secondary | ICD-10-CM | POA: Insufficient documentation

## 2020-02-05 DIAGNOSIS — R197 Diarrhea, unspecified: Secondary | ICD-10-CM | POA: Insufficient documentation

## 2020-02-05 DIAGNOSIS — R112 Nausea with vomiting, unspecified: Secondary | ICD-10-CM | POA: Insufficient documentation

## 2020-02-05 LAB — LIPASE, BLOOD: Lipase: 22 U/L (ref 11–51)

## 2020-02-05 LAB — CBC WITH DIFFERENTIAL/PLATELET
Abs Immature Granulocytes: 0.01 10*3/uL (ref 0.00–0.07)
Basophils Absolute: 0 10*3/uL (ref 0.0–0.1)
Basophils Relative: 0 %
Eosinophils Absolute: 0.1 10*3/uL (ref 0.0–0.5)
Eosinophils Relative: 1 %
HCT: 38.8 % (ref 36.0–46.0)
Hemoglobin: 13.4 g/dL (ref 12.0–15.0)
Immature Granulocytes: 0 %
Lymphocytes Relative: 7 %
Lymphs Abs: 0.5 10*3/uL — ABNORMAL LOW (ref 0.7–4.0)
MCH: 32.9 pg (ref 26.0–34.0)
MCHC: 34.5 g/dL (ref 30.0–36.0)
MCV: 95.3 fL (ref 80.0–100.0)
Monocytes Absolute: 0.4 10*3/uL (ref 0.1–1.0)
Monocytes Relative: 5 %
Neutro Abs: 6 10*3/uL (ref 1.7–7.7)
Neutrophils Relative %: 87 %
Platelets: 257 10*3/uL (ref 150–400)
RBC: 4.07 MIL/uL (ref 3.87–5.11)
RDW: 13.8 % (ref 11.5–15.5)
WBC: 6.9 10*3/uL (ref 4.0–10.5)
nRBC: 0 % (ref 0.0–0.2)

## 2020-02-05 LAB — URINALYSIS, ROUTINE W REFLEX MICROSCOPIC
Bilirubin Urine: NEGATIVE
Glucose, UA: NEGATIVE mg/dL
Hgb urine dipstick: NEGATIVE
Ketones, ur: NEGATIVE mg/dL
Leukocytes,Ua: NEGATIVE
Nitrite: NEGATIVE
Protein, ur: NEGATIVE mg/dL
Specific Gravity, Urine: 1.01 (ref 1.005–1.030)
pH: 5.5 (ref 5.0–8.0)

## 2020-02-05 LAB — COMPREHENSIVE METABOLIC PANEL
ALT: 19 U/L (ref 0–44)
AST: 28 U/L (ref 15–41)
Albumin: 4 g/dL (ref 3.5–5.0)
Alkaline Phosphatase: 89 U/L (ref 38–126)
Anion gap: 9 (ref 5–15)
BUN: 19 mg/dL (ref 6–20)
CO2: 22 mmol/L (ref 22–32)
Calcium: 8.6 mg/dL — ABNORMAL LOW (ref 8.9–10.3)
Chloride: 105 mmol/L (ref 98–111)
Creatinine, Ser: 0.81 mg/dL (ref 0.44–1.00)
GFR calc Af Amer: >60 mL/min (ref >60)
GFR calc non Af Amer: >60 mL/min (ref >60)
Glucose, Bld: 117 mg/dL — ABNORMAL HIGH (ref 70–99)
Potassium: 3.5 mmol/L (ref 3.5–5.1)
Sodium: 136 mmol/L (ref 135–145)
Total Bilirubin: 0.7 mg/dL (ref 0.3–1.2)
Total Protein: 7.7 g/dL (ref 6.5–8.1)

## 2020-02-05 LAB — SARS CORONAVIRUS 2 AG (30 MIN TAT): SARS Coronavirus 2 Ag: NEGATIVE

## 2020-02-05 LAB — LACTIC ACID, PLASMA: Lactic Acid, Venous: 1.9 mmol/L (ref 0.5–1.9)

## 2020-02-05 MED ORDER — ONDANSETRON HCL 4 MG/2ML IJ SOLN
4.0000 mg | Freq: Once | INTRAMUSCULAR | Status: AC
  Administered 2020-02-05: 4 mg via INTRAVENOUS
  Filled 2020-02-05: qty 2

## 2020-02-05 MED ORDER — METRONIDAZOLE IN NACL 5-0.79 MG/ML-% IV SOLN
500.0000 mg | Freq: Once | INTRAVENOUS | Status: AC
  Administered 2020-02-05: 500 mg via INTRAVENOUS
  Filled 2020-02-05: qty 100

## 2020-02-05 MED ORDER — IOHEXOL 300 MG/ML  SOLN
100.0000 mL | Freq: Once | INTRAMUSCULAR | Status: AC | PRN
  Administered 2020-02-05: 20:00:00 100 mL via INTRAVENOUS

## 2020-02-05 MED ORDER — SODIUM CHLORIDE 0.9 % IV SOLN
2.0000 g | Freq: Once | INTRAVENOUS | Status: AC
  Administered 2020-02-05: 2 g via INTRAVENOUS
  Filled 2020-02-05: qty 20

## 2020-02-05 MED ORDER — ACETAMINOPHEN 325 MG PO TABS
650.0000 mg | ORAL_TABLET | Freq: Once | ORAL | Status: AC
  Administered 2020-02-05: 650 mg via ORAL
  Filled 2020-02-05: qty 2

## 2020-02-05 MED ORDER — DICYCLOMINE HCL 20 MG PO TABS: 20.0000 mg | ORAL_TABLET | Freq: Two times a day (BID) | ORAL | 0 refills | Status: DC

## 2020-02-05 MED ORDER — DICYCLOMINE HCL 10 MG PO CAPS
10.0000 mg | ORAL_CAPSULE | Freq: Once | ORAL | Status: AC
  Administered 2020-02-05: 10 mg via ORAL
  Filled 2020-02-05: qty 1

## 2020-02-05 MED ORDER — ONDANSETRON 4 MG PO TBDP: 4.0000 mg | ORAL_TABLET | Freq: Three times a day (TID) | ORAL | 0 refills | Status: DC | PRN

## 2020-02-05 MED ORDER — MORPHINE SULFATE (PF) 4 MG/ML IV SOLN
4.0000 mg | Freq: Once | INTRAVENOUS | Status: AC
  Administered 2020-02-05: 4 mg via INTRAVENOUS
  Filled 2020-02-05: qty 1

## 2020-02-05 MED ORDER — SODIUM CHLORIDE 0.9 % IV BOLUS
1000.0000 mL | Freq: Once | INTRAVENOUS | Status: AC
  Administered 2020-02-05: 1000 mL via INTRAVENOUS

## 2020-02-05 NOTE — ED Notes (Signed)
ED Provider at bedside. 

## 2020-02-05 NOTE — ED Provider Notes (Signed)
[facility] Provider Note   CSN: [id number] Arrival date & time: 02/05/20  1752     History Chief Complaint  Patient presents with  . Abdominal Pain    Robin Trevino is a 59 y.o. female with PMHx anxiety and depression who presents to the ED today complaining of sudden onset, constant, waxing and waning, pulling, periumbilical abdominal pain that began this morning around 5-6 AM. Pt also complains of nausea, nonbloody nonbilious emesis, and diarrhea. She reports about 3-4 episodes of each. No BRBPR or melena. Pt reports she began having chills throughout the day however was unaware she had a fever. On arrival to the ED pt's temp 103 F. She does mention that she went drinking last night - reports about 5-6 shots of vodka and tequila. Pt states that it was a celebration and she does not normally drink that heavy. No recent abx use. PSHx includes cholecystectomy and 2 cesarean sections. No cough, shortness of breath, urinary symptoms, pelvic pain, or any other associated symptoms.   The history is provided by the patient and medical records.       Past Medical History:  Diagnosis Date  . Anxiety   . Depression   . Mild intermittent asthma 08/29/2015    Patient Active Problem List   Diagnosis Date Noted  . 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 Allergic rhinitis due to pollen 08/29/2015  . Mild intermittent asthma 08/29/2015    Past Surgical History:  Procedure Laterality Date  . CESAREAN SECTION  1996  . CHOLECYSTECTOMY Bilateral 1992  . [place]  . ECTOPIC PREGNANCY SURGERY  2000     OB History   No obstetric history on file.     Family History  Problem Relation Age of Onset  . Asthma Son   . Asthma Mother   . Allergic rhinitis Mother   . Asthma Brother     Social History   Tobacco Use  . Smoking status: Never Smoker  . Smokeless tobacco:  Never Used  Substance Use Topics  . Alcohol use: Yes    Comment: occ  . Drug use: No    Home Medications Prior to Admission medications   Medication Sig Start Date End Date Taking? Authorizing Provider  Albuterol Sulfate (PROAIR RESPICLICK) 408 (90 BASE) MCG/ACT AEPB Inhale 2 puffs into the lungs every 6 (six) hours. 08/29/15   Charlies Silvers, MD  BIOTIN PO Take 500 mg by mouth daily.    [provider]  dicyclomine (BENTYL) 20 MG tablet Take 1 tablet (20 mg total) by mouth 2 (two) times daily. 02/05/20   Alroy Bailiff, Ivin Rosenbloom, PA-C  diphenhydrAMINE (BENADRYL) 25 MG tablet Take 2 tablets (50 mg total) by mouth every 6 (six) hours. Take 1-2 tablets every 6 hours x 2 days, then space out to an as needed basis 06/06/14   Mabe, Forbes Cellar, MD  EPINEPHrine (EPIPEN 2-PAK) 0.3 mg/0.3 mL IJ SOAJ injection Inject 0.3 mLs (0.3 mg total) into the muscle once. 08/29/15   Charlies Silvers, MD  ferrous sulfate 325 (65 FE) MG tablet Take 325 mg by mouth daily.    [provider]  omeprazole (PRILOSEC) 20 MG capsule Take one capsule daily at least 30 minutes before first dose of Carafate. 09/17/16   Molpus, John, MD  ondansetron (ZOFRAN ODT) 4 MG disintegrating tablet Take 1 tablet (4 mg total) by mouth every 8 (eight)  hours as needed for nausea or vomiting. 02/05/20   Alroy Bailiff, Latondra Gebhart, PA-C  sucralfate (CARAFATE) 1 g tablet Take 1 tablet (1 g total) by mouth 4 (four) times daily -  with meals and at bedtime. 09/17/16   Molpus, John, MD  triamcinolone (NASACORT) 55 MCG/ACT AERO nasal inhaler Place 1 spray into the nose as needed.    [provider]  valACYclovir (VALTREX) 500 MG tablet Take 500 mg by mouth as needed. 11/27/11   [provider]    Allergies    Amoxicillin, Donnatal [belladonna alk-phenobarb er], Lamotrigine, and Phenobarbital-belladonna alk  Review of Systems   Review of Systems  Constitutional: Positive for chills and fever.  Respiratory: Negative for cough and  shortness of breath.   Cardiovascular: Negative for chest pain.  Gastrointestinal: Positive for abdominal pain, diarrhea, nausea and vomiting.  Genitourinary: Negative for dysuria, flank pain and frequency.  Musculoskeletal: Negative for myalgias.  All other systems reviewed and are negative.   Physical Exam Updated Vital Signs BP 131/88 (BP Location: Right Arm)   Pulse (!) 121   Temp (!) 103 F (39.4 C) (Oral)   Resp (!) 24   Ht _0  (1.626 m)   Wt 63.5 kg   LMP 06/01/2014   SpO2 98%   BMI 24.03 kg/m   Physical Exam Vitals and nursing note reviewed.  Constitutional:      Appearance: She is ill-appearing. She is not diaphoretic.  HENT:     Head: Normocephalic and atraumatic.  Eyes:     Conjunctiva/sclera: Conjunctivae normal.  Cardiovascular:     Rate and Rhythm: Regular rhythm. Tachycardia present.  Pulmonary:     Effort: Pulmonary effort is normal.     Breath sounds: Normal breath sounds. No wheezing, rhonchi or rales.  Abdominal:     Palpations: Abdomen is soft.     Tenderness: There is abdominal tenderness in the right lower quadrant and periumbilical area. There is guarding (voluntary). There is no right CVA tenderness, left CVA tenderness or rebound. Negative signs include Rovsing's sign, psoas sign and obturator sign.  Musculoskeletal:     Cervical back: Neck supple.  Skin:    General: Skin is warm and dry.  Neurological:     Mental Status: She is alert.     ED Results / Procedures / Treatments   Labs (all labs ordered are listed, but only abnormal results are displayed) Labs Reviewed  COMPREHENSIVE METABOLIC PANEL - Abnormal; Notable for the following components:      Result Value   Glucose, Bld 117 (*)    Calcium 8.6 (*)    All other components within normal limits  CBC WITH DIFFERENTIAL/PLATELET - Abnormal; Notable for the following components:   Lymphs Abs 0.5 (*)    All other components within normal limits  SARS CORONAVIRUS 2 AG (30 MIN TAT)   CULTURE, BLOOD (ROUTINE X 2)  CULTURE, BLOOD (ROUTINE X 2)  URINE CULTURE  URINALYSIS, ROUTINE W REFLEX MICROSCOPIC  LACTIC ACID, PLASMA  LIPASE, BLOOD    EKG EKG Interpretation  Date/Time:  Saturday February 05 2020 18:15:30 EDT Ventricular Rate:  113 PR Interval:    QRS Duration: 98 QT Interval:  341 QTC Calculation: 468 R Axis:   90 Text Interpretation: Sinus tachycardia Consider right atrial enlargement Borderline right axis deviation Baseline wander in lead(s) V5 No significant change since last tracing Confirmed by Isla Pence [phone]) on 02/05/2020 6:20:02 PM   Radiology CT Abdomen Pelvis W Contrast  Result Date: 02/05/2020 CLINICAL  DATA:  Right lower quadrant pain EXAM: CT ABDOMEN AND PELVIS WITH CONTRAST TECHNIQUE: Multidetector CT imaging of the abdomen and pelvis was performed using the standard protocol following bolus administration of intravenous contrast. CONTRAST:  136m OMNIPAQUE IOHEXOL 300 MG/ML  SOLN COMPARISON:  01/19/2018 FINDINGS: Lower chest: No acute abnormality. Hepatobiliary: No focal liver abnormality is seen. Status post cholecystectomy. No biliary dilatation. Pancreas: Unremarkable. Spleen: Unremarkable. Adrenals/Urinary Tract: Decrease in size of small left upper pole renal cyst. Bladder is unremarkable. Adrenals are unremarkable. Stomach/Bowel: Stomach is within normal limits. Bowel is normal in caliber. Appendix is not visualized. Vascular/Lymphatic: No significant vascular findings are present. No enlarged abdominal or pelvic lymph nodes. Reproductive: Uterus is unremarkable. Increase in size of somewhat tubular and septated appearing low attenuation left adnexal structure. Other: No abdominal wall hernia or abnormality. No abdominopelvic ascites. Musculoskeletal: No acute or significant osseous findings. IMPRESSION: No findings to account for reported symptoms. Possible mild left hydrosalpinx. Electronically Signed   By: PMacy MisM.D.   On: 02/05/2020  20:03    Procedures Procedures (including critical care time)  Medications Ordered in ED Medications  sodium chloride 0.9 % bolus 1,000 mL (0 mLs Intravenous Stopped 02/05/20 2046)  acetaminophen (TYLENOL) tablet 650 mg (650 mg Oral Given 02/05/20 1828)  cefTRIAXone (ROCEPHIN) 2 g in sodium chloride 0.9 % 100 mL IVPB (0 g Intravenous Stopped 02/05/20 1915)  metroNIDAZOLE (FLAGYL) IVPB 500 mg (0 mg Intravenous Stopped 02/05/20 2046)  ondansetron (ZOFRAN) injection 4 mg (4 mg Intravenous Given 02/05/20 1833)  morphine 4 MG/ML injection 4 mg (4 mg Intravenous Given 02/05/20 1835)  iohexol (OMNIPAQUE) 300 MG/ML solution 100 mL (100 mLs Intravenous Contrast Given 02/05/20 1940)    ED Course  I have reviewed the triage vital signs and the nursing notes.  Pertinent labs & imaging results that were available during my care of the patient were reviewed by me and considered in my medical decision making (see chart for details).  59year old female who presents to the ED today complaining of sudden onset severe periumbilical abdominal pain, nausea, vomiting, diarrhea this morning.  She did drink heavily last night.  On arrival to the ED patient is febrile at 103, tachycardic at 121, tachypneic at 24.  She appears uncomfortable on exam.  She has tenderness in the periumbilical and right lower quadrant regions.  She has voluntary guarding.  Will obtain CT scan at this time.  There is concern for possible appendicitis however given patient's history of alcohol last night she may very well have viral gastroenteritis.  Patient does currently meet SEPSIS criteria given vitals.  Will empirically start on intra-abdominal antibiotics.  Blood cultures and labs obtained.  Will also swab for Covid.   CBC without leukocytosis.  Hemoglobin stable at 13.4. CMP with glucose 117 and calcium 8.6. No other electrolyte abnormalities.  Lipase 22.  Lactic acid within normal limits at 1.9.   Rapid COVID negative.   CT scan  without acute findings. Does show questionable hydrosalpinx on left however no tenderness in this area. Do not feel this is cause of pt's symptoms today.   On reeval patient resting more comfortably. Heart rate in the low 100's now. Will fluid challenge at this time and reevaluate. If able to tolerate PO pt to be discharged home with symptomatic treatment. Pt declines PCR COVID test at this time with the understanding that we cannot rule it out with this test. She denies risk of exposure.  Pt able to tolerate fluids without difficulty.  Pt to be discharged home at this time.     Discharge Instructions     Please pick up medications and take as prescribed to help with nausea and abdominal cramping  Drink plenty of fluids to stay hydrated  Take Tylenol as needed for fevers  Follow up with your PCP regarding your ED visit Return to the ED IMMEDIATELY for any worsening symptoms including worsening pain, excessive vomiting despite anti nausea medications, dizziness/lightheadedness, diarrhea that lasts > 10 days       MDM Rules/Calculators/A&P                       Final Clinical Impression(s) / ED Diagnoses Final diagnoses:  Periumbilical abdominal pain  Nausea vomiting and diarrhea  Viral gastroenteritis    Rx / DC Orders ED Discharge Orders         Ordered    dicyclomine (BENTYL) 20 MG tablet  2 times daily     02/05/20 2123    ondansetron (ZOFRAN ODT) 4 MG disintegrating tablet  Every 8 hours PRN     02/05/20 2123           Discharge Instructions     Please pick up medications and take as prescribed to help with nausea and abdominal cramping  Drink plenty of fluids to stay hydrated  Take Tylenol as needed for fevers  Follow up with your PCP regarding your ED visit Return to the ED IMMEDIATELY for any worsening symptoms including worsening pain, excessive vomiting despite anti nausea medications, dizziness/lightheadedness, diarrhea that lasts > 10 days        Eustaquio Maize, Hershal Coria 02/05/20 2153    Isla Pence, MD 02/05/20 2157

## 2020-02-05 NOTE — ED Triage Notes (Signed)
Pt arrives to ED with reports of severe abdominal pain starting around 4-5 this morning. Pt has had NVD c/o RLQ pain.

## 2020-02-05 NOTE — Discharge Instructions (Signed)
Please pick up medications and take as prescribed to help with nausea and abdominal cramping  Drink plenty of fluids to stay hydrated  Take Tylenol as needed for fevers  Follow up with your PCP regarding your ED visit Return to the ED IMMEDIATELY for any worsening symptoms including worsening pain, excessive vomiting despite anti nausea medications, dizziness/lightheadedness, diarrhea that lasts > 10 days

## 2020-02-05 NOTE — ED Notes (Addendum)
Pt given gingerale and cup of ice per PO challenge. Pt states will call out if N/V/D occur

## 2020-02-05 NOTE — ED Notes (Signed)
Patient transported to CT 

## 2020-02-07 LAB — URINE CULTURE: Culture: NO GROWTH

## 2020-02-10 LAB — CULTURE, BLOOD (ROUTINE X 2)
Culture: NO GROWTH
Culture: NO GROWTH
Special Requests: ADEQUATE
Special Requests: ADEQUATE

## 2021-03-24 IMAGING — CT CT ABD-PELV W/ CM
2 of 5 series · 16 of 46 positions shown, 18 images · IV contrast (omnipaque)
Comparison: 01/19/2018

CLINICAL DATA: Right lower quadrant pain

EXAM:
CT ABDOMEN AND PELVIS WITH CONTRAST
TECHNIQUE: Multidetector CT imaging of the abdomen and pelvis was performed
using the standard protocol following bolus administration of
intravenous contrast.
CONTRAST:  100mL OMNIPAQUE IOHEXOL 300 MG/ML  SOLN

[Series 2: axial st · axial · 0.75mm/px · z∈[-257,+123]mm · 13 of 86 slices shown, 15 images]
[im 5/86  soft-tissue]
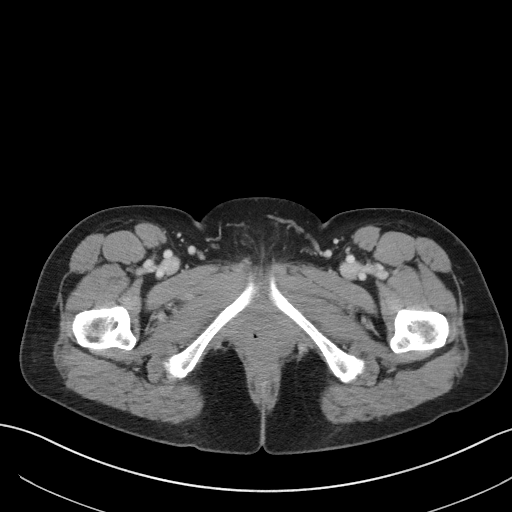
[im 5/86  bone]
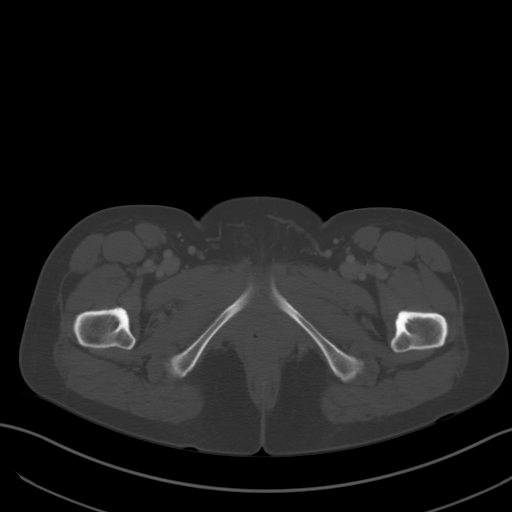
[im 13/86  soft-tissue]
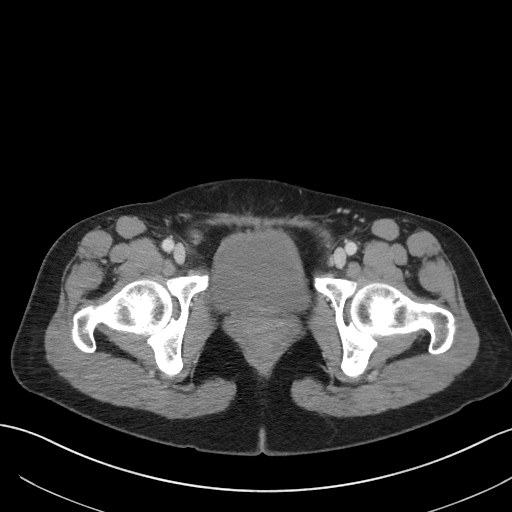
[im 18/86  soft-tissue]
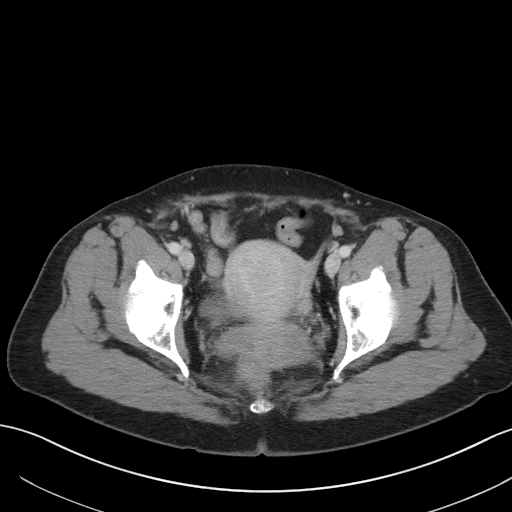
[im 26/86  soft-tissue]
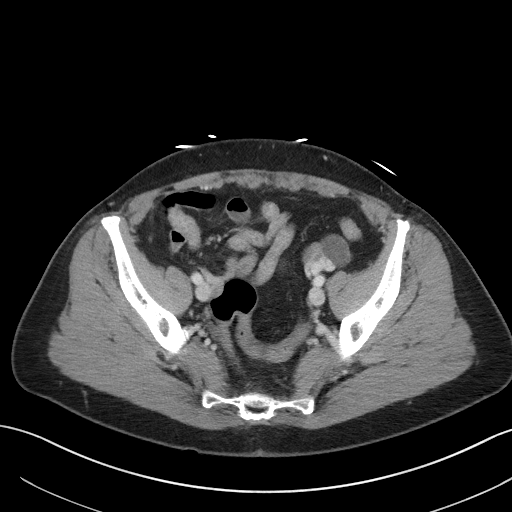
[im 30/86  soft-tissue]
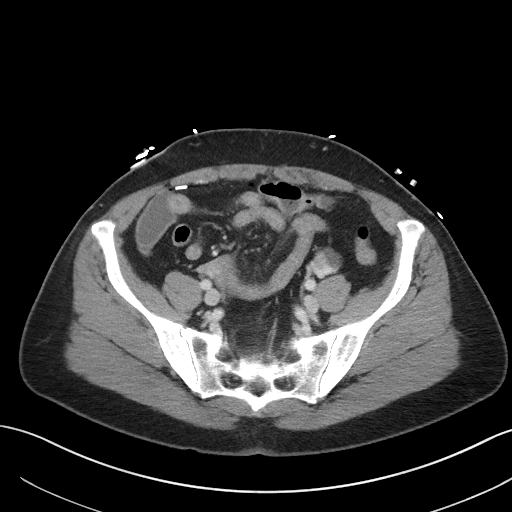
[im 39/86  soft-tissue]
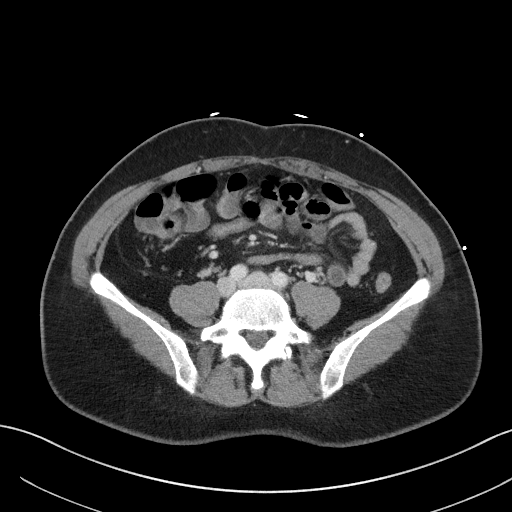
[im 43/86  soft-tissue]
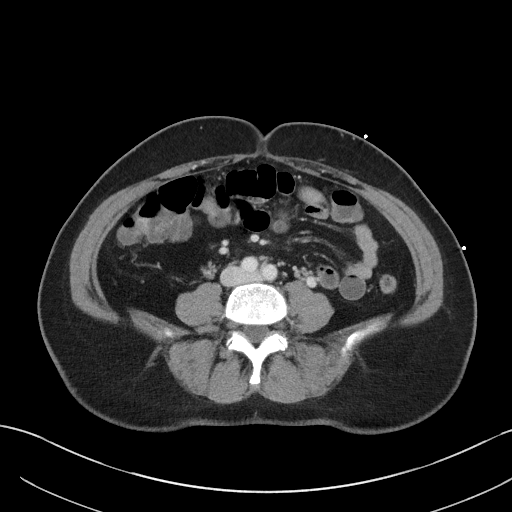
[im 47/86  soft-tissue]
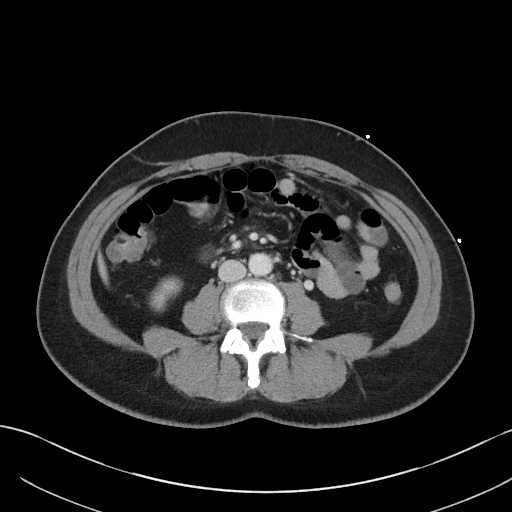
[im 56/86  soft-tissue]
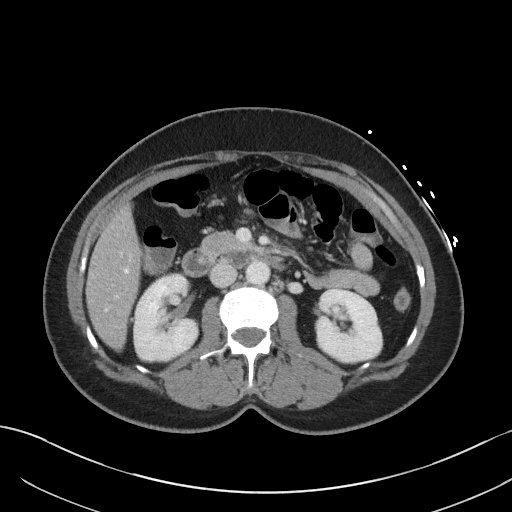
[im 56/86  bone]
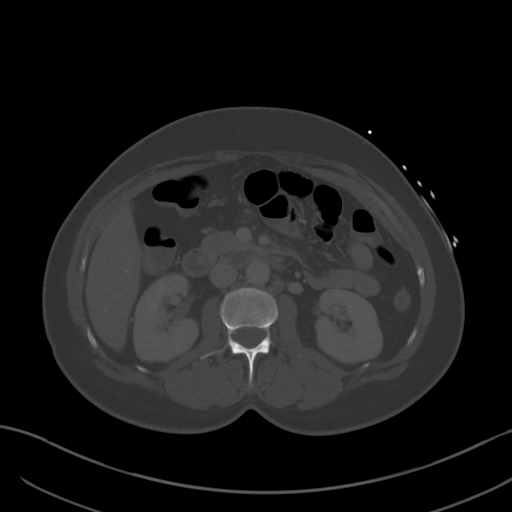
[im 60/86  soft-tissue]
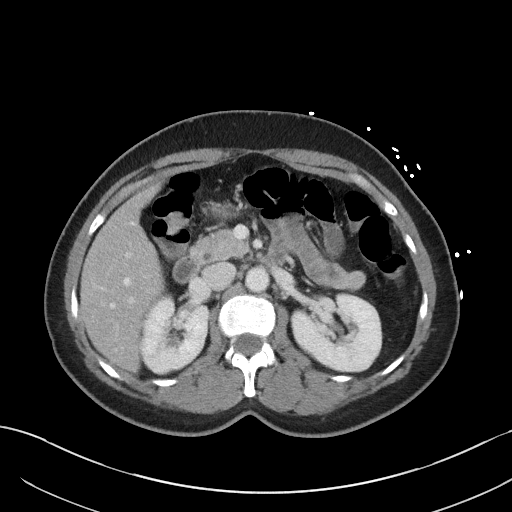
[im 69/86  soft-tissue]
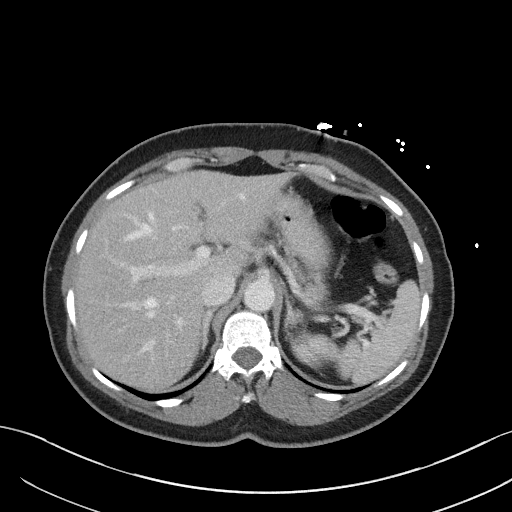
[im 73/86  soft-tissue]
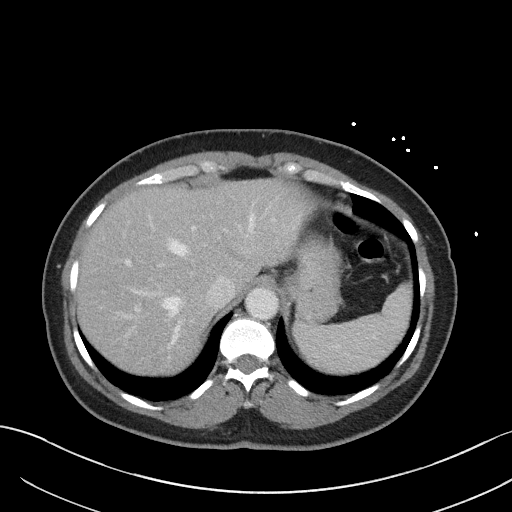
[im 81/86  soft-tissue]
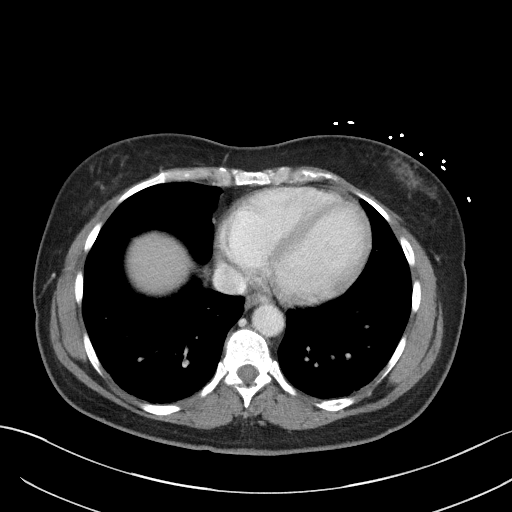

[Series 5: coronal st · coronal · 0.63mm/px · 3 of 83 slices shown]
[im 28/83  soft-tissue]
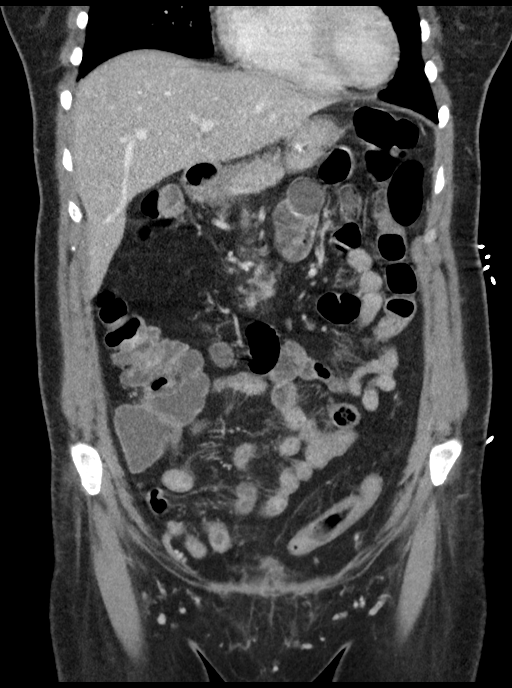
[im 37/83  soft-tissue]
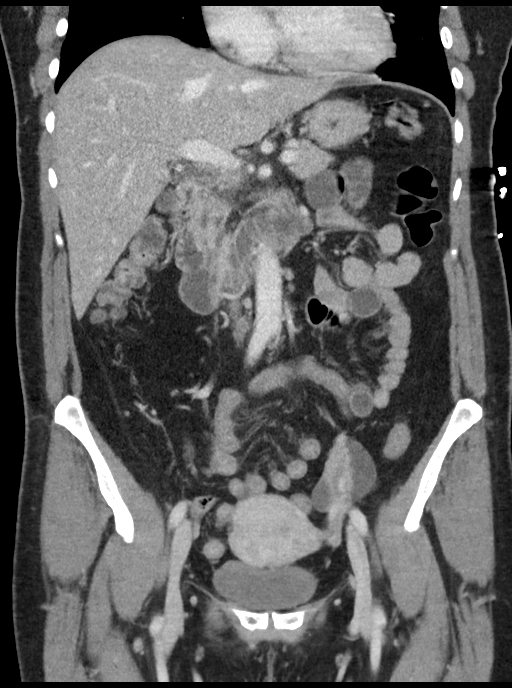
[im 46/83  soft-tissue]
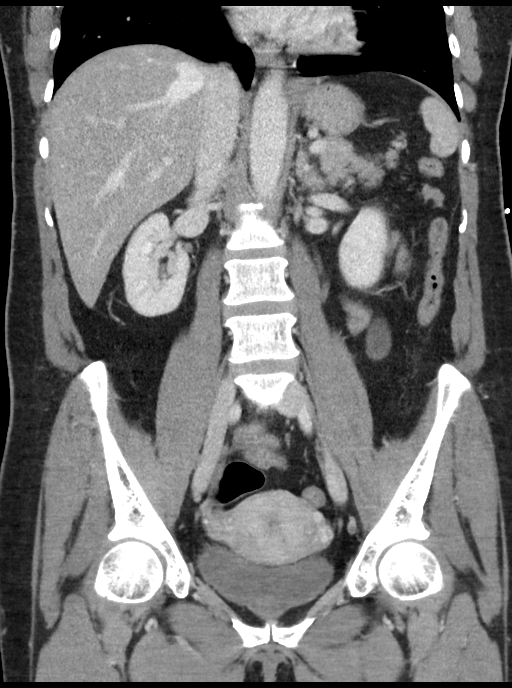

[16 of 46 positions shown; findings below may reference images not displayed]

FINDINGS: Lower chest: No acute abnormality.

Hepatobiliary: No focal liver abnormality is seen. Status post
cholecystectomy. No biliary dilatation.

Pancreas: Unremarkable.

Spleen: Unremarkable.

Adrenals/Urinary Tract: Decrease in size of small left upper pole
renal cyst. Bladder is unremarkable. Adrenals are unremarkable.

Stomach/Bowel: Stomach is within normal limits. Bowel is normal in
caliber. Appendix is not visualized.

Vascular/Lymphatic: No significant vascular findings are present. No
enlarged abdominal or pelvic lymph nodes.

Reproductive: Uterus is unremarkable. Increase in size of somewhat
tubular and septated appearing low attenuation left adnexal
structure.

Other: No abdominal wall hernia or abnormality. No abdominopelvic
ascites.

Musculoskeletal: No acute or significant osseous findings.
IMPRESSION: No findings to account for reported symptoms.

Possible mild left hydrosalpinx.

## 2021-09-08 ENCOUNTER — Encounter (HOSPITAL_BASED_OUTPATIENT_CLINIC_OR_DEPARTMENT_OTHER): Payer: Self-pay | Admitting: Emergency Medicine

## 2021-09-08 ENCOUNTER — Other Ambulatory Visit: Payer: Self-pay

## 2021-09-08 ENCOUNTER — Emergency Department (HOSPITAL_BASED_OUTPATIENT_CLINIC_OR_DEPARTMENT_OTHER): Payer: Self-pay

## 2021-09-08 ENCOUNTER — Emergency Department (HOSPITAL_BASED_OUTPATIENT_CLINIC_OR_DEPARTMENT_OTHER)
Admission: EM | Admit: 2021-09-08 | Discharge: 2021-09-09 | Disposition: A | Discharge status: Home / Self Care | Payer: Self-pay | Attending: Emergency Medicine | Admitting: Emergency Medicine

## 2021-09-08 DIAGNOSIS — F439 Reaction to severe stress, unspecified: Secondary | ICD-10-CM

## 2021-09-08 DIAGNOSIS — R000 Tachycardia, unspecified: Secondary | ICD-10-CM | POA: Insufficient documentation

## 2021-09-08 DIAGNOSIS — F419 Anxiety disorder, unspecified: Secondary | ICD-10-CM | POA: Insufficient documentation

## 2021-09-08 DIAGNOSIS — F430 Acute stress reaction: Secondary | ICD-10-CM | POA: Insufficient documentation

## 2021-09-08 DIAGNOSIS — R45851 Suicidal ideations: Secondary | ICD-10-CM | POA: Insufficient documentation

## 2021-09-08 DIAGNOSIS — J4520 Mild intermittent asthma, uncomplicated: Secondary | ICD-10-CM | POA: Insufficient documentation

## 2021-09-08 DIAGNOSIS — R251 Tremor, unspecified: Secondary | ICD-10-CM | POA: Insufficient documentation

## 2021-09-08 DIAGNOSIS — R0789 Other chest pain: Secondary | ICD-10-CM | POA: Insufficient documentation

## 2021-09-08 DIAGNOSIS — R0602 Shortness of breath: Secondary | ICD-10-CM | POA: Insufficient documentation

## 2021-09-08 DIAGNOSIS — R110 Nausea: Secondary | ICD-10-CM | POA: Insufficient documentation

## 2021-09-08 DIAGNOSIS — Z20822 Contact with and (suspected) exposure to covid-19: Secondary | ICD-10-CM | POA: Insufficient documentation

## 2021-09-08 DIAGNOSIS — R202 Paresthesia of skin: Secondary | ICD-10-CM | POA: Insufficient documentation

## 2021-09-08 DIAGNOSIS — R079 Chest pain, unspecified: Secondary | ICD-10-CM

## 2021-09-08 DIAGNOSIS — R413 Other amnesia: Secondary | ICD-10-CM | POA: Insufficient documentation

## 2021-09-08 DIAGNOSIS — Z7951 Long term (current) use of inhaled steroids: Secondary | ICD-10-CM | POA: Insufficient documentation

## 2021-09-08 DIAGNOSIS — R4182 Altered mental status, unspecified: Secondary | ICD-10-CM | POA: Insufficient documentation

## 2021-09-08 DIAGNOSIS — R519 Headache, unspecified: Secondary | ICD-10-CM | POA: Insufficient documentation

## 2021-09-08 LAB — COMPREHENSIVE METABOLIC PANEL
ALT: 22 U/L (ref 0–44)
AST: 29 U/L (ref 15–41)
Albumin: 4.5 g/dL (ref 3.5–5.0)
Alkaline Phosphatase: 158 U/L — ABNORMAL HIGH (ref 38–126)
Anion gap: 9 (ref 5–15)
BUN: 19 mg/dL (ref 6–20)
CO2: 22 mmol/L (ref 22–32)
Calcium: 9.3 mg/dL (ref 8.9–10.3)
Chloride: 107 mmol/L (ref 98–111)
Creatinine, Ser: 0.73 mg/dL (ref 0.44–1.00)
GFR, Estimated: >60 mL/min (ref >60)
Glucose, Bld: 104 mg/dL — ABNORMAL HIGH (ref 70–99)
Potassium: 3.7 mmol/L (ref 3.5–5.1)
Sodium: 138 mmol/L (ref 135–145)
Total Bilirubin: 0.4 mg/dL (ref 0.3–1.2)
Total Protein: 8.4 g/dL — ABNORMAL HIGH (ref 6.5–8.1)

## 2021-09-08 LAB — RESP PANEL BY RT-PCR (FLU A&B, COVID) ARPGX2
Influenza A by PCR: NEGATIVE
Influenza B by PCR: NEGATIVE
SARS Coronavirus 2 by RT PCR: NEGATIVE

## 2021-09-08 LAB — CBC WITH DIFFERENTIAL/PLATELET
Abs Immature Granulocytes: 0.01 10*3/uL (ref 0.00–0.07)
Basophils Absolute: 0 10*3/uL (ref 0.0–0.1)
Basophils Relative: 0 %
Eosinophils Absolute: 0.2 10*3/uL (ref 0.0–0.5)
Eosinophils Relative: 3 %
HCT: 37.6 % (ref 36.0–46.0)
Hemoglobin: 13.1 g/dL (ref 12.0–15.0)
Immature Granulocytes: 0 %
Lymphocytes Relative: 53 %
Lymphs Abs: 3.5 10*3/uL (ref 0.7–4.0)
MCH: 32 pg (ref 26.0–34.0)
MCHC: 34.8 g/dL (ref 30.0–36.0)
MCV: 91.7 fL (ref 80.0–100.0)
Monocytes Absolute: 0.6 10*3/uL (ref 0.1–1.0)
Monocytes Relative: 9 %
Neutro Abs: 2.3 10*3/uL (ref 1.7–7.7)
Neutrophils Relative %: 35 %
Platelets: 345 10*3/uL (ref 150–400)
RBC: 4.1 MIL/uL (ref 3.87–5.11)
RDW: 12.8 % (ref 11.5–15.5)
WBC: 6.6 10*3/uL (ref 4.0–10.5)
nRBC: 0 % (ref 0.0–0.2)

## 2021-09-08 LAB — D-DIMER, QUANTITATIVE: D-Dimer, Quant: 0.62 ug/mL-FEU — ABNORMAL HIGH (ref 0.00–0.50)

## 2021-09-08 LAB — TROPONIN I (HIGH SENSITIVITY): Troponin I (High Sensitivity): 2 ng/L (ref <18)

## 2021-09-08 MED ORDER — IOHEXOL 350 MG/ML SOLN
100.0000 mL | Freq: Once | INTRAVENOUS | Status: AC | PRN
  Administered 2021-09-08: 80 mL via INTRAVENOUS

## 2021-09-08 MED ORDER — DIPHENHYDRAMINE HCL 50 MG/ML IJ SOLN
50.0000 mg | Freq: Once | INTRAMUSCULAR | Status: AC
  Administered 2021-09-08: 50 mg via INTRAVENOUS
  Filled 2021-09-08: qty 1

## 2021-09-08 MED ORDER — LORAZEPAM 2 MG/ML IJ SOLN
0.5000 mg | Freq: Once | INTRAMUSCULAR | Status: AC
  Administered 2021-09-08: 0.5 mg via INTRAVENOUS
  Filled 2021-09-08: qty 1

## 2021-09-08 NOTE — ED Provider Notes (Signed)
[facility] EMERGENCY DEPARTMENT Provider Note   CSN: [id number] Arrival date & time: 09/08/21  2112     History Chief Complaint  Patient presents with   Chest Pain   Altered Mental Status    Robin Trevino is a 60 y.o. female.  HPI     60yo female with history of anxiety, depression, intermittent asthma who presents with concern for confusion for the last 3 days, chest pain, anxiety, headache.   She is tearful and anxious at time of evaluation.  Chest pain is tightness, centrally located, has some left side and right side as well. Not worse with deep breaths but does have shortness of breath or feels like can't get a deep breath.  Feels tight.  Nausea, no vomiting. No diarrhea, black or bloody stools.  No fevers.  No cough.  Had a recent flight, during which her headache started 2 weeks ago. Has had constant headache.  Had flashes/floaters and saw eye doctor and was diagnosed with?vitreus hemorrhage.  Woke up this morning with right arm tingling, then got better, then has been waxing and waning frequently today based on position. No other numbness, weakness, difficulty speaking, walking or change in vision.   Headache is severe, has been going on over last month. Anxiety and depression worse over last month. Has felt confused and not herself the last few days. Is crying "I don't know what is happening.."  Her sister died at the beginning of the month, and her son's father has a brain tumor. 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  Had her zoloft increased a few weeks ago from 15m to 517m since that time has felt jittery, anxious.  Sees psychiatrist and therapist. Has had some SI and had family remove guns from home.  Lives alone. Not having active SI at this time.   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     Family History  Problem Relation Age of Onset   Asthma Son    Asthma Mother    Allergic rhinitis Mother    Asthma Brother     Social History   Tobacco Use   Smoking status: Never   Smokeless tobacco: Never  Substance Use Topics   Alcohol use: Yes    Comment: occ   Drug use: No    Home Medications Prior to Admission medications   Medication Sig Start Date End Date Taking? Authorizing Provider  Albuterol Sulfate (PROAIR RESPICLICK) [id number] BASE) MCG/ACT AEPB Inhale 2 puffs into the lungs every 6 (six) hours. 08/29/15   BaCharlies SilversMD  BIOTIN PO Take 500 mg by mouth daily.    [provider]  dicyclomine (BENTYL) 20 MG tablet Take 1 tablet (20 mg total) by mouth 2 (two) times daily. 02/05/20   VeEustaquio MaizePA-C  diphenhydrAMINE (BENADRYL)  25 MG tablet Take 2 tablets (50 mg total) by mouth every 6 (six) hours. Take 1-2 tablets every 6 hours x 2 days, then space out to an as needed basis 06/06/14   Mabe, Forbes Cellar, MD  EPINEPHrine (EPIPEN 2-PAK) 0.3 mg/0.3 mL IJ SOAJ injection Inject 0.3 mLs (0.3 mg total) into the muscle once. 08/29/15   Charlies Silvers, MD  ferrous sulfate 325 (65 FE) MG tablet Take 325 mg by mouth daily.    [provider]  omeprazole (PRILOSEC) 20 MG capsule Take one capsule daily at least 30 minutes before first dose of Carafate. 09/17/16   Molpus, Jenny Reichmann, MD  ondansetron (ZOFRAN ODT) 4 MG disintegrating tablet Take 1 tablet (4 mg total) by mouth every 8 (eight) hours as needed for nausea or vomiting. 02/05/20    Alroy Bailiff, Margaux, PA-C  sucralfate (CARAFATE) 1 g tablet Take 1 tablet (1 g total) by mouth 4 (four) times daily -  with meals and at bedtime. 09/17/16   Molpus, John, MD  triamcinolone (NASACORT) 55 MCG/ACT AERO nasal inhaler Place 1 spray into the nose as needed.    [provider]  valACYclovir (VALTREX) 500 MG tablet Take 500 mg by mouth as needed. 11/27/11   [provider]    Allergies    Amoxicillin, Donnatal [belladonna alk-phenobarb er], Lamotrigine, Penicillins, Phenobarbital-belladonna alk, and Contrast media [iodinated diagnostic agents]  Review of Systems   Review of Systems  Constitutional:  Positive for appetite change and fatigue. Negative for fever.  HENT:  Negative for sore throat.   Eyes:  Negative for visual disturbance.  Respiratory:  Positive for shortness of breath. Negative for cough.   Cardiovascular:  Positive for chest pain.  Gastrointestinal:  Positive for nausea. Negative for abdominal pain, diarrhea and vomiting.  Genitourinary:  Negative for difficulty urinating.  Musculoskeletal:  Negative for back pain and neck pain.  Skin:  Negative for rash.  Neurological:  Positive for headaches. Negative for dizziness, syncope, facial asymmetry, speech difficulty and weakness. Numbness: tinglign. Psychiatric/Behavioral:  Positive for confusion.    Physical Exam Updated Vital Signs BP 116/75   Pulse 77   Temp 97.9 F (36.6 C) (Oral)   Resp 14   Ht 5' 4"  (1.626 m)   Wt 65.8 kg   LMP 06/01/2014   SpO2 95%   BMI 24.89 kg/m   Physical Exam Vitals and nursing note reviewed.  Constitutional:      General: She is not in acute distress.    Appearance: Normal appearance. She is well-developed. She is not ill-appearing or diaphoretic.     Comments: Active, moving, frequently shifting position  HENT:     Head: Normocephalic and atraumatic.  Eyes:     General: No visual field deficit.    Extraocular Movements: Extraocular movements intact.      Conjunctiva/sclera: Conjunctivae normal.     Pupils: Pupils are equal, round, and reactive to light.  Cardiovascular:     Rate and Rhythm: Regular rhythm. Tachycardia present.     Pulses: Normal pulses.     Heart sounds: Normal heart sounds. No murmur heard.   No friction rub. No gallop.  Pulmonary:     Effort: Pulmonary effort is normal. No respiratory distress.     Breath sounds: Normal breath sounds. No wheezing or rales.  Abdominal:     General: There is no distension.     Palpations: Abdomen is soft.     Tenderness: There is no abdominal tenderness. There is no guarding.  Musculoskeletal:        General: No swelling or tenderness.     Cervical back: Normal range of motion.  Skin:    General: Skin is warm and dry.     Findings: No erythema or rash.  Neurological:     General: No focal deficit present.     Mental Status: She is alert and oriented to person, place, and time.     GCS: GCS eye subscore is 4. GCS verbal subscore is 5. GCS motor subscore is 6.     Cranial Nerves: No cranial nerve deficit, dysarthria or facial asymmetry.     Sensory: No sensory deficit.     Motor: Tremor present. No weakness.     Coordination: Coordination normal. Finger-Nose-Finger Test normal.     Gait: Gait normal.  Psychiatric:        Mood and Affect: Mood is anxious.        Thought Content: Thought content includes suicidal ideation. Thought content does not include suicidal plan.    ED Results / Procedures / Treatments   Labs (all labs ordered are listed, but only abnormal results are displayed) Labs Reviewed  COMPREHENSIVE METABOLIC PANEL - Abnormal; Notable for the following components:      Result Value   Glucose, Bld 104 (*)    Total Protein 8.4 (*)    Alkaline Phosphatase 158 (*)    All other components within normal limits  D-DIMER, QUANTITATIVE - Abnormal; Notable for the following components:   D-Dimer, Quant 0.62 (*)    All other components within normal limits  RESP PANEL  BY RT-PCR (FLU A&B, COVID) ARPGX2  CBC WITH DIFFERENTIAL/PLATELET  TSH  T4, FREE  TRYPTASE  TROPONIN I (HIGH SENSITIVITY)  TROPONIN I (HIGH SENSITIVITY)    EKG None  Radiology CT Head Wo Contrast  Result Date: 09/08/2021 CLINICAL DATA:  Altered mental status. EXAM: CT HEAD WITHOUT CONTRAST TECHNIQUE: Contiguous axial images were obtained from the base of the skull through the vertex without intravenous contrast. COMPARISON:  August 11, 2010 FINDINGS: Brain: No evidence of acute infarction, hemorrhage, hydrocephalus, extra-axial collection or mass lesion/mass effect. Vascular: No hyperdense vessel or unexpected calcification. Skull: Normal. Negative for fracture or focal lesion. Sinuses/Orbits: No acute finding. Other: None. IMPRESSION: No acute intracranial pathology. Electronically Signed   By: Virgina Norfolk M.D.   On: 09/08/2021 22:04   CT Angio Chest PE W and/or Wo Contrast  Result Date: 09/08/2021 CLINICAL DATA:  Chest pain, shortness of breath EXAM: CT ANGIOGRAPHY CHEST WITH CONTRAST TECHNIQUE: Multidetector CT imaging of the chest was performed using the standard protocol during bolus administration of intravenous contrast. Multiplanar CT image reconstructions and MIPs were obtained to evaluate the vascular anatomy. CONTRAST:  68m OMNIPAQUE IOHEXOL 350 MG/ML SOLN COMPARISON:  None FINDINGS: Cardiovascular: Satisfactory opacification of the pulmonary arteries to the segmental level. No evidence of pulmonary embolism. Normal heart size. No pericardial effusion. Mediastinum/Nodes: No enlarged mediastinal, hilar, or axillary lymph nodes. Thyroid gland, trachea, and esophagus demonstrate no significant findings. Lungs/Pleura: Lungs are clear. No pleural effusion or pneumothorax. Upper Abdomen: No acute abnormality. Musculoskeletal: No chest wall abnormality. No acute or significant osseous findings. Review of the MIP images confirms the above findings. IMPRESSION: Negative for pulmonary  embolism.  No acute process in the chest. Electronically Signed   By: AMerilyn BabaM.D.   On: 09/08/2021 22:52   DG Chest Portable 1 View  Result Date: 09/08/2021 CLINICAL DATA:  Chest pain EXAM: PORTABLE CHEST 1 VIEW  COMPARISON:  Chest x-ray 12/30/2017 FINDINGS: The heart and mediastinal contours are within normal limits. There is a 1.5 cm nodularity overlying the left lower lobe that likely represents a nipple shadow. No focal consolidation. No pulmonary edema. No pleural effusion. No pneumothorax. No acute osseous abnormality. IMPRESSION: No active disease. Electronically Signed   By: Iven Finn M.D.   On: 09/08/2021 22:11    Procedures Procedures   Medications Ordered in ED Medications  iohexol (OMNIPAQUE) 350 MG/ML injection 100 mL (80 mLs Intravenous Contrast Given 09/08/21 2234)  diphenhydrAMINE (BENADRYL) injection 50 mg (50 mg Intravenous Given 09/08/21 2301)  LORazepam (ATIVAN) injection 0.5 mg (0.5 mg Intravenous Given 09/08/21 2301)    ED Course  I have reviewed the triage vital signs and the nursing notes.  Pertinent labs & imaging results that were available during my care of the patient were reviewed by me and considered in my medical decision making (see chart for details).    MDM Rules/Calculators/A&P                            61yo female with history of anxiety, depression, intermittent asthma who presents with concern for confusion for the last 3 days, chest pain, anxiety, headache.  Regarding headache, confusion--no focal findings at this time on neurologic exam, low suspicion for CVA. Frequency of arm tingling not consistent with TIA. CT head without acute intracranial bleed. Is able to appropriately participate in history and exam, recommend continued outpatient follow up for these symptoms.  EKG without significant findings. Troponin negative x2 and have low suspicion for ACS. DDimer positive, CT PE study without acute findings. Low suspicion for aortic  dissection by history and exam.  No significant anemia, electrolyte abnormality.  After CT, she described feeling dyspnea, with severe anxiety.  Evaluated and found to have no objective findings of anaphylaxis, no stridor, wheezing, rash, tongue or throat swelling. She is extremely anxious, similar to prior.  Given benadryl and ativan with improvement in symptoms.  Discussed that I am reluctant to call this an anaphylactic reaction given no objective findings, in setting of severe anxiety, knowing what the effects would be with this label for future necessary medical work ups. Sent a tryptase level, recommend follow up as an outpatient regarding this.  Placed note in allergy section but do not feel this was allergy.  Improved after benadryl, ativan. Discussed all results with patient and son in detail.  Given combination of multiple symptoms in setting of significant anxiety, depression, stress feel this may be unifying diagnosis.  TSH and tryptase levels pending. Discussed recommendation for continued outpatient follow up for her memory concerns, chest pain.  She may be experiencing side effects of increased zoloft dosing---not clear full serotonin syndrome, no fever, but do recommend decreasing medication back to 67m and discussing with her psychiatrist.  She described some SI but not active, contracts for safety and has close follow up.  Discussed all with patient and son. Recommend rest, stress relief, outpt follow up. Patient discharged in stable condition with understanding of reasons to return.   Final Clinical Impression(s) / ED Diagnoses Final diagnoses:  Chest pain, unspecified type  Acute nonintractable headache, unspecified headache type  Memory problem  Stress  Anxiety    Rx / DC Orders ED Discharge Orders     None        SGareth Morgan MD 09/09/21 [phone]

## 2021-09-08 NOTE — ED Triage Notes (Addendum)
Pt states increasing feeling of confusion over the "last few days", and chest pain for the last hour. She pinpoints pain to under her L breast. She also reports palpitations. Recent flight. Denies pain on inspiration. States her R arm "kept falling asleep" intermittently today - tingling and numbness". Pt appears very anxious, tearful, fidgety. EDP called to triage to assess. She also reports flashers/floaters that recently developed but she did see dr about this. Pt is also noted to have tremor and intermittent jerking of various body parts. She is taking Zoloft with a recent dose increase. VSS once pt calmer.

## 2021-09-08 NOTE — ED Notes (Signed)
Possible allergic reaction to CT contrast (Omnipaque 350) 61ml given. About 2 minutes post injection patient complained of chest tightness and difficulty swallowing. Pt's voice was leaving and felt like her airway became difficult. RN immediately made aware. MD and staff currently assisting the patient.

## 2021-09-09 LAB — TROPONIN I (HIGH SENSITIVITY): Troponin I (High Sensitivity): 2 ng/L (ref <18)

## 2021-09-09 LAB — TSH: TSH: 2.225 u[IU]/mL (ref 0.350–4.500)

## 2021-09-09 NOTE — Discharge Instructions (Addendum)
Decrease your zoloft down to 25mg  again from 50mg  and discuss with your psychiatrist.

## 2021-09-10 LAB — T4, FREE: Free T4: 0.96 ng/dL (ref 0.61–1.12)

## 2021-09-11 LAB — TRYPTASE: Tryptase: 3.4 ug/L (ref 2.2–13.2)

## 2021-11-18 HISTORY — PX: EYE SURGERY: SHX253

## 2022-10-26 IMAGING — CT CT ANGIO CHEST
2 of 8 series · 19 of 36 positions shown · IV contrast (Omnipaque)
Comparison: None

CLINICAL DATA: Chest pain, shortness of breath

EXAM:
CT ANGIOGRAPHY CHEST WITH CONTRAST
TECHNIQUE: Multidetector CT imaging of the chest was performed using the
standard protocol during bolus administration of intravenous
contrast. Multiplanar CT image reconstructions and MIPs were
obtained to evaluate the vascular anatomy.
CONTRAST:  80mL OMNIPAQUE IOHEXOL 350 MG/ML SOLN

[Series 6: pe thins · axial · 0.70mm/px · z∈[+1038,+1252]mm · 18 of 240 slices shown]
[im 13/240  lung]
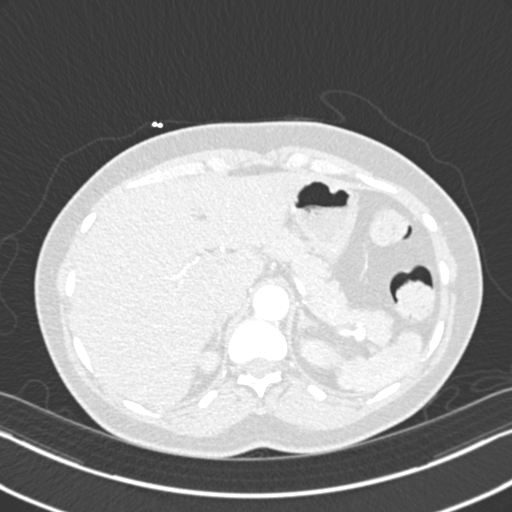
[im 26/240  mediastinal]
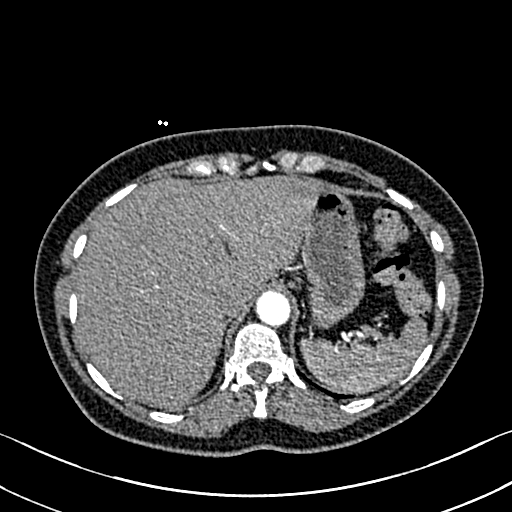
[im 38/240  lung]
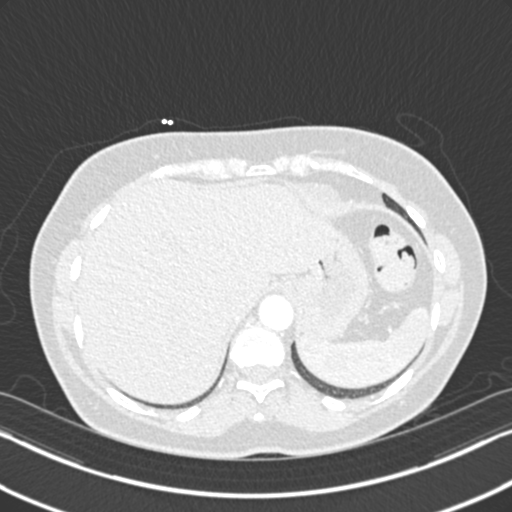
[im 51/240  mediastinal]
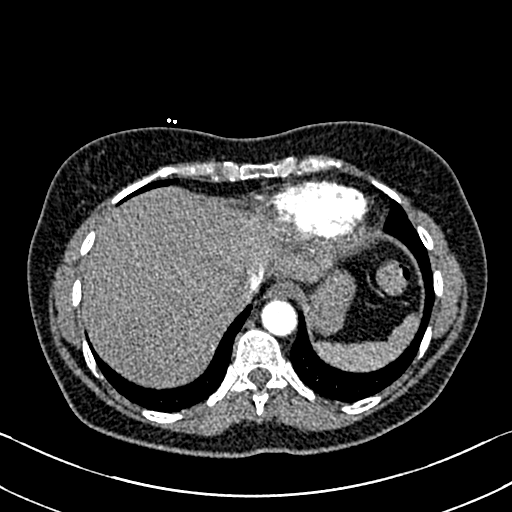
[im 63/240  lung]
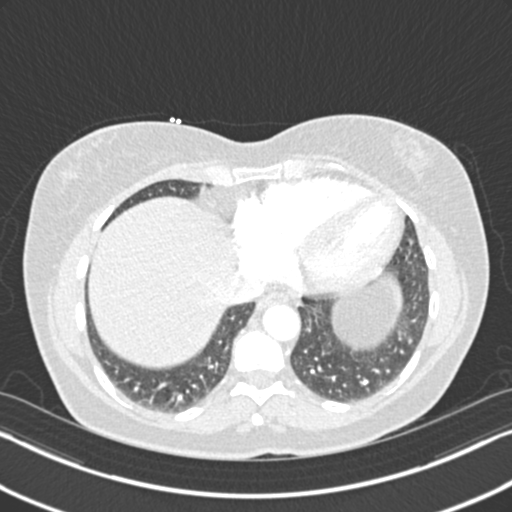
[im 76/240  mediastinal]
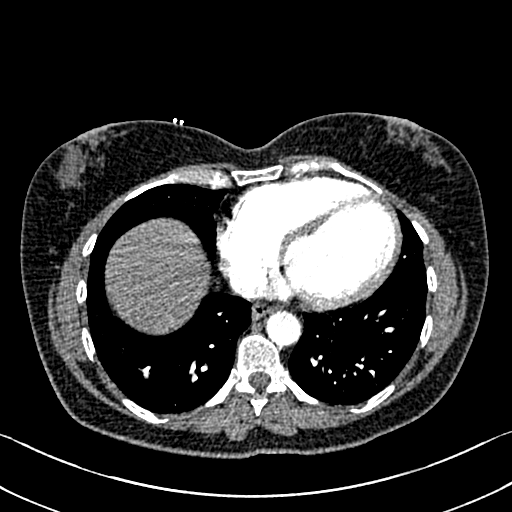
[im 89/240  lung]
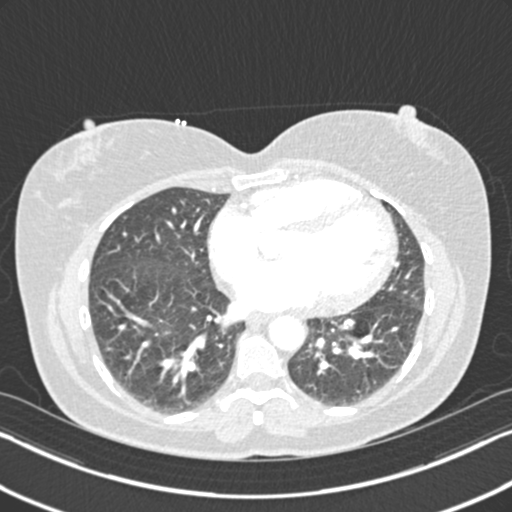
[im 101/240  mediastinal]
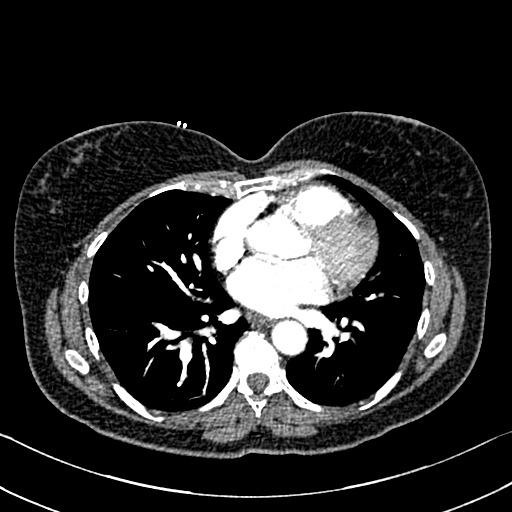
[im 114/240  lung]
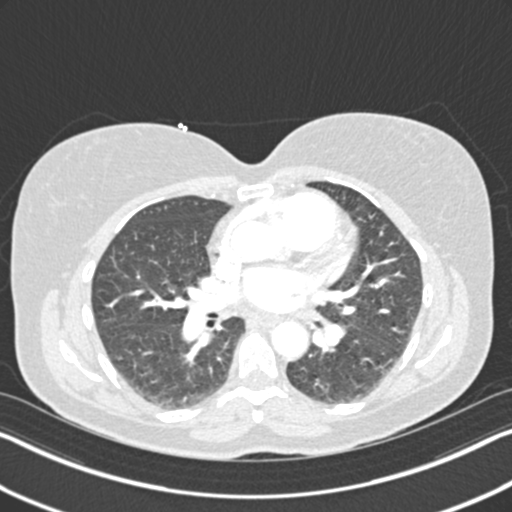
[im 126/240  mediastinal]
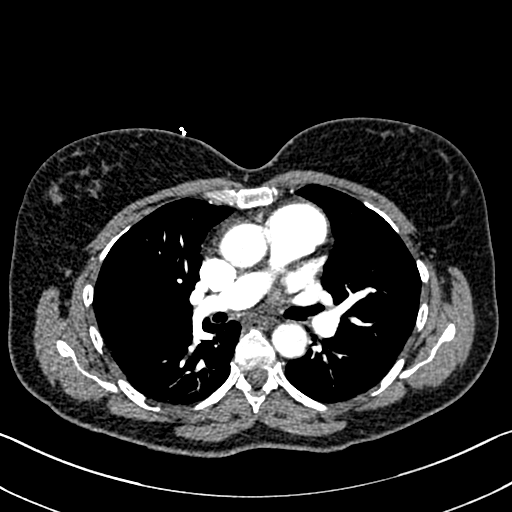
[im 139/240  lung]
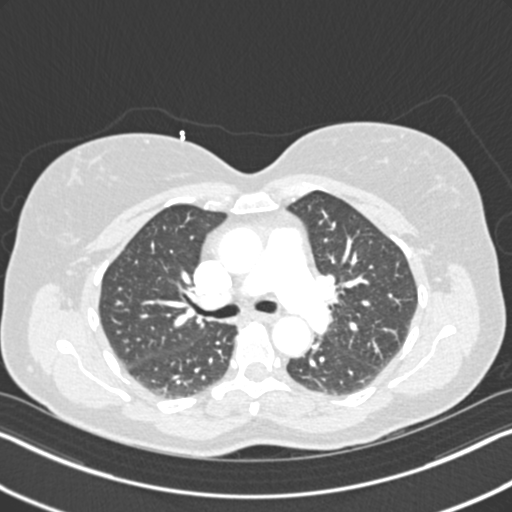
[im 151/240  mediastinal]
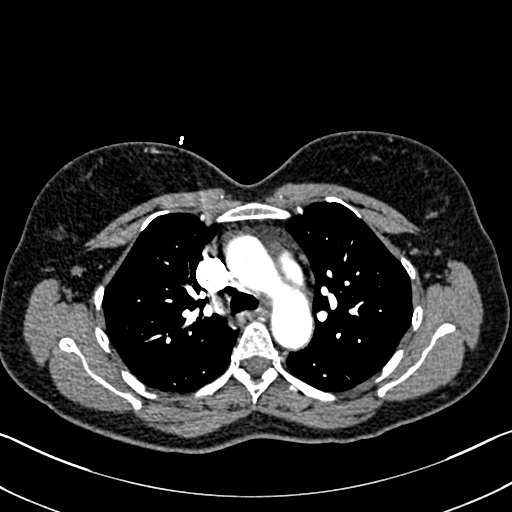
[im 164/240  lung]
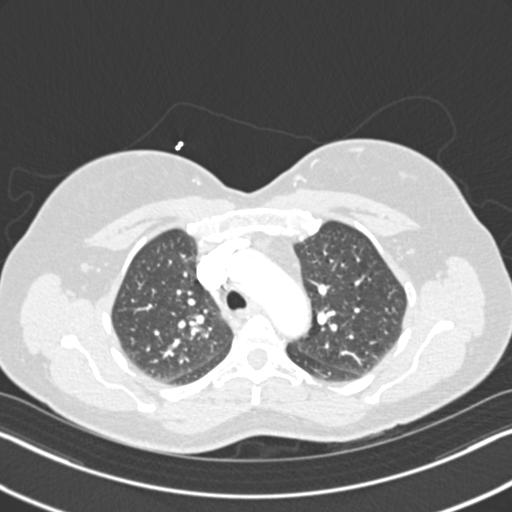
[im 177/240  mediastinal]
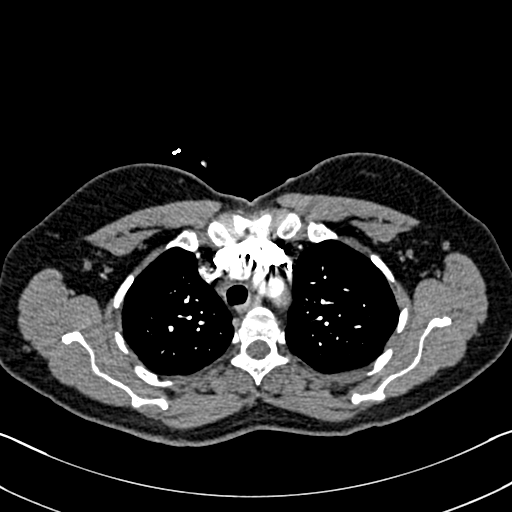
[im 189/240  lung]
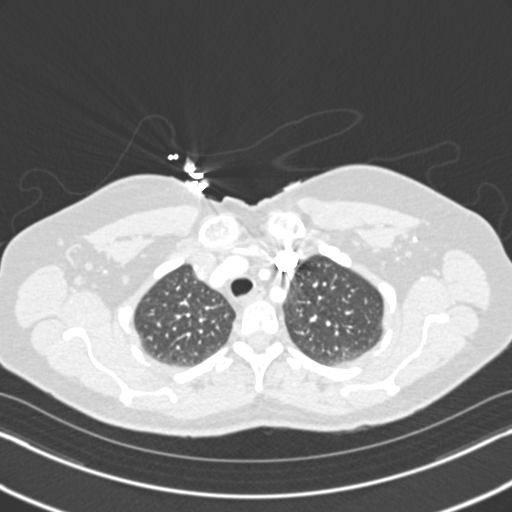
[im 202/240  mediastinal]
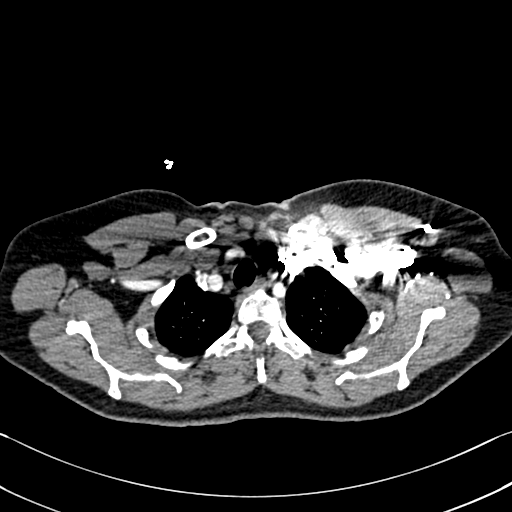
[im 214/240  lung]
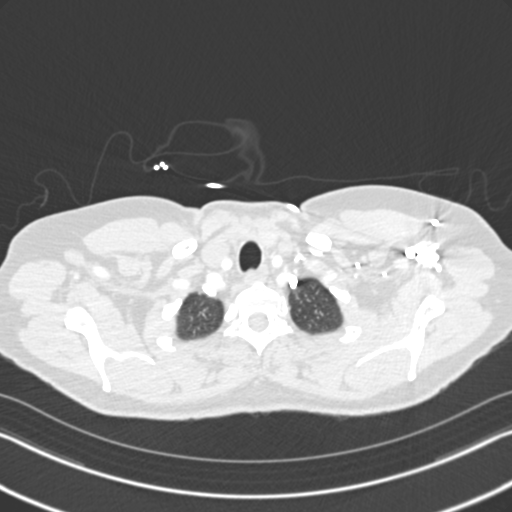
[im 227/240  mediastinal]
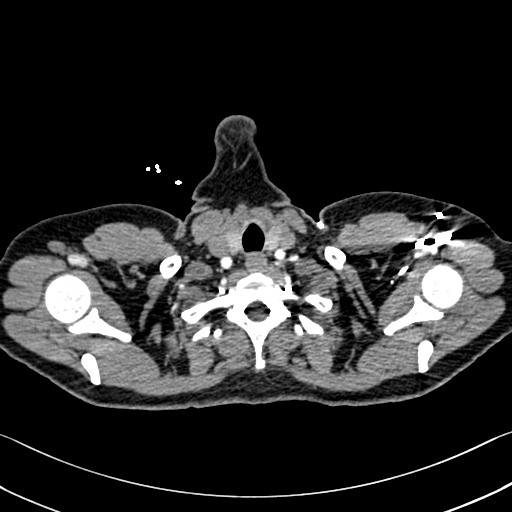

[Series 7: pe coronal mpr · coronal · 0.49mm/px · 1 of 128 slices shown]
[im 64/128  mediastinal]
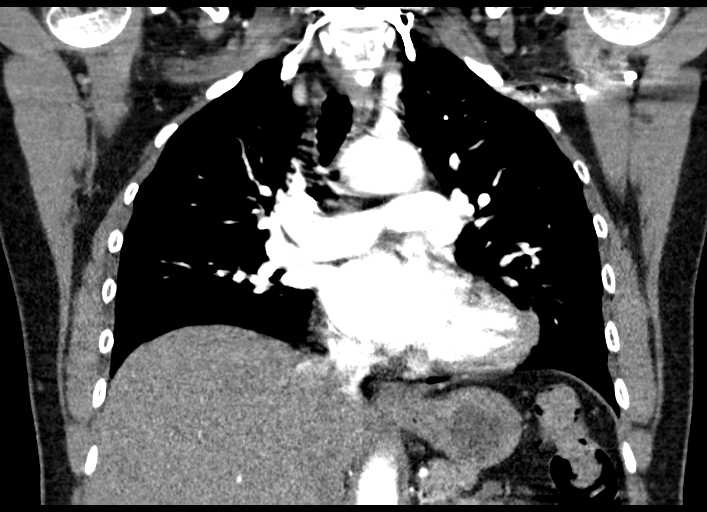

[19 of 36 positions shown; findings below may reference images not displayed]

FINDINGS: Cardiovascular: Satisfactory opacification of the pulmonary arteries
to the segmental level. No evidence of pulmonary embolism. Normal
heart size. No pericardial effusion.

Mediastinum/Nodes: No enlarged mediastinal, hilar, or axillary lymph
nodes. Thyroid gland, trachea, and esophagus demonstrate no
significant findings.

Lungs/Pleura: Lungs are clear. No pleural effusion or pneumothorax.

Upper Abdomen: No acute abnormality.

Musculoskeletal: No chest wall abnormality. No acute or significant
osseous findings.

Review of the MIP images confirms the above findings.
IMPRESSION: Negative for pulmonary embolism.  No acute process in the chest.

## 2024-02-05 DIAGNOSIS — T7840XA Allergy, unspecified, initial encounter: Secondary | ICD-10-CM | POA: Insufficient documentation

## 2024-03-26 ENCOUNTER — Ambulatory Visit (INDEPENDENT_AMBULATORY_CARE_PROVIDER_SITE_OTHER): Payer: Medicaid Other | Admitting: Family Medicine

## 2024-03-26 ENCOUNTER — Encounter: Payer: Self-pay | Admitting: Family Medicine

## 2024-03-26 VITALS — BP 118/68 | HR 78 | Temp 98.5°F | Ht 64.0 in | Wt 148.5 lb

## 2024-03-26 DIAGNOSIS — Z7689 Persons encountering health services in other specified circumstances: Secondary | ICD-10-CM

## 2024-03-26 DIAGNOSIS — Z23 Encounter for immunization: Secondary | ICD-10-CM | POA: Diagnosis not present

## 2024-03-26 DIAGNOSIS — G43109 Migraine with aura, not intractable, without status migrainosus: Secondary | ICD-10-CM

## 2024-03-26 DIAGNOSIS — E785 Hyperlipidemia, unspecified: Secondary | ICD-10-CM

## 2024-03-26 DIAGNOSIS — F332 Major depressive disorder, recurrent severe without psychotic features: Secondary | ICD-10-CM

## 2024-03-26 DIAGNOSIS — J301 Allergic rhinitis due to pollen: Secondary | ICD-10-CM

## 2024-03-26 LAB — COMPREHENSIVE METABOLIC PANEL WITH GFR
AG Ratio: 1.4 (calc) (ref 1.0–2.5)
ALT: 16 U/L (ref 6–29)
AST: 21 U/L (ref 10–35)
Albumin: 4.5 g/dL (ref 3.6–5.1)
Alkaline phosphatase (APISO): 105 U/L (ref 37–153)
BUN: 15 mg/dL (ref 7–25)
CO2: 28 mmol/L (ref 20–32)
Calcium: 9.7 mg/dL (ref 8.6–10.4)
Chloride: 103 mmol/L (ref 98–110)
Creat: 0.74 mg/dL (ref 0.50–1.05)
Globulin: 3.2 g/dL (ref 1.9–3.7)
Glucose, Bld: 92 mg/dL (ref 65–99)
Potassium: 4.6 mmol/L (ref 3.5–5.3)
Sodium: 138 mmol/L (ref 135–146)
Total Bilirubin: 0.5 mg/dL (ref 0.2–1.2)
Total Protein: 7.7 g/dL (ref 6.1–8.1)
eGFR: 93 mL/min/{1.73_m2} (ref >=60)

## 2024-03-26 LAB — CBC WITH DIFFERENTIAL/PLATELET
Absolute Lymphocytes: 2139 {cells}/uL (ref 850–3900)
Absolute Monocytes: 538 {cells}/uL (ref 200–950)
Basophils Absolute: 22 {cells}/uL (ref 0–200)
Basophils Relative: 0.4 %
Eosinophils Absolute: 370 {cells}/uL (ref 15–500)
Eosinophils Relative: 6.6 %
HCT: 40.3 % (ref 35.0–45.0)
Hemoglobin: 13.4 g/dL (ref 11.7–15.5)
MCH: 30.7 pg (ref 27.0–33.0)
MCHC: 33.3 g/dL (ref 32.0–36.0)
MCV: 92.4 fL (ref 80.0–100.0)
MPV: 10.2 fL (ref 7.5–12.5)
Monocytes Relative: 9.6 %
Neutro Abs: 2531 {cells}/uL (ref 1500–7800)
Neutrophils Relative %: 45.2 %
Platelets: 285 10*3/uL (ref 140–400)
RBC: 4.36 10*6/uL (ref 3.80–5.10)
RDW: 12.4 % (ref 11.0–15.0)
Total Lymphocyte: 38.2 %
WBC: 5.6 10*3/uL (ref 3.8–10.8)

## 2024-03-26 LAB — LIPID PANEL
Cholesterol: 304 mg/dL — ABNORMAL HIGH (ref <200)
HDL: 54 mg/dL (ref >=50)
LDL Cholesterol (Calc): 206 mg/dL — ABNORMAL HIGH
Non-HDL Cholesterol (Calc): 250 mg/dL — ABNORMAL HIGH (ref <130)
Total CHOL/HDL Ratio: 5.6 (calc) — ABNORMAL HIGH (ref <5.0)
Triglycerides: 236 mg/dL — ABNORMAL HIGH (ref <150)

## 2024-03-26 LAB — TSH: TSH: 2.39 m[IU]/L (ref 0.40–4.50)

## 2024-03-26 LAB — VITAMIN D 25 HYDROXY (VIT D DEFICIENCY, FRACTURES): Vit D, 25-Hydroxy: 35 ng/mL (ref 30–100)

## 2024-03-26 NOTE — Progress Notes (Signed)
 Patient Office Visit  Assessment & Plan:   Encounter to establish care -     CBC with Differential/Platelet -     Comprehensive metabolic panel with GFR -     TSH -     VITAMIN D 25 Hydroxy (Vit-D Deficiency, Fractures)  Need for shingles vaccine  Severe episode of recurrent major depressive disorder, without psychotic features (HCC)  Hyperlipidemia, unspecified hyperlipidemia type -     Lipid panel  Migraine with aura and without status migrainosus, not intractable  Seasonal allergic rhinitis due to pollen   Patient will return for physical next time. Recommend healthy diet i.e mediterranean/DASH diet, consistent exercise - 30 minutes 5 day per week, and gradual weight loss. Test results were reviewed and analyzed as part of the medical decision making of this visit.  Reviewed previous notes from [facility] and previous lab work.  Declines Shingrix and pneumococcal vaccines today. Return if symptoms worsen or fail to improve, for physical.   Subjective:    Patient ID: Robin Trevino, female    DOB: 1961/09/04  Age: 63 y.o. MRN: [id number]  Chief Complaint  Patient presents with   Medical Management of Chronic Issues   Establish Care    HPI Discussed the use of AI scribe software for clinical note transcription with the patient, who gave verbal consent to proceed. Patient his here to establish care- has not been seen by me in over 2 years. Pt thought this was a CPE today.  History of Present Illness   Robin Trevino is a 64 year old female who presents for medication refill and follow-up.  She is currently taking Buspar and Celexa for mental health, prescribed by her psychiatrist, whom she sees monthly or every other month. She also uses over-the-counter Zyrtec, Flonase, and Pataday for allergies, which are particularly severe in March and April. She carries an EpiPen  for food allergies, specifically to fresh fruits, and had a recent allergic reaction last  month requiring hospitalization after consuming coleslaw with apples.  She experiences migraines, typically unilateral, and recently had a severe episode accompanied by earache, throat pain, fever, and body aches, which she suspects may have been related to an ear infection. She notes persistent swelling in her throat area and is concerned about her thyroid .  Her family history includes two brothers with colon cancer, one of whom passed away. She had a colonoscopy in 2021, which showed mild colitis, and she is on a five-year follow-up schedule due to family history. She also reports a history of high cholesterol, which has not been recently checked, and she has not been taking prescribed medication for it.  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 She has not been exercising regularly for the past year and has noticed weight fluctuations, currently weighing 148 pounds. She reports a lack of appetite and a sedentary lifestyle, contributing to her weight changes.     Physical Exam MEASUREMENTS: Weight- 148. Results DIAGNOSTIC Colonoscopy: Polyps (2021) per Dr. Andra Banco in [place], needs repeat in 5 years.  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 Recent anaphylactic reaction required EpiPen  use and hospital observation this past February and went to [facility] in [facility] - Avoid known allergens, especially fresh fruits. - Carry EpiPen  at all times and educated on its use. -  Referred to allergist for further evaluation and management.  Migraine Headaches primarily unilateral, possibly related to recent severe allergy symptoms. - Consider migraine prophylaxis if headaches persist. - Symptomatic treatment with NSAIDs as needed. - Monitor for signs  of infection and treat accordingly.  Mild asthma Mild asthma exacerbated during winter and with overexertion. No current exacerbations reported. - Continue current asthma management plan. - Educated on avoiding triggers and proper inhaler technique.  Allergic rhinitis Managed with Zyrtec, Flonase, and Pataday. Symptoms were particularly severe in March and April. - Continue current medication regimen. - Consider allergist referral if symptoms worsen.  Depression and Anxiety- Managed with Buspar and Celexa. Reports feelings of disillusionment and isolation, with decreased appetite and weight loss. - Continue psychiatric follow-ups. - Monitor for changes in mood and appetite. - Consider therapy referral for additional support.  Hx of polyps/FH colon CA/Colitis Mild inflammation noted in the last colonoscopy in 2021. Family history of colon cancer with two brothers affected, one deceased. - Continue surveillance colonoscopy every five years. - Educated on signs of colitis exacerbation and when to seek care. Hyperlipidemia-does not like the idea of taking statin therapy.  Aware of need for diet control, exercise and healthy eating.   General Health Maintenance Discussed shingles and pneumonia vaccinations. Declined shingles vaccine due to personal belief of low risk. Reluctant about pneumonia vaccine despite mild asthma and age. - Conduct blood work including thyroid  and cholesterol levels. - Schedule follow-up for complete physical examination. - Ensure mammogram and pelvic exam are up to date. Needs mammogram in July 2025 - Review colonoscopy results and ensure follow-up as per surveillance plan.  The 10-year ASCVD risk score (Arnett DK, et al., 2019) is: 3.7% The 10-year ASCVD risk score (Arnett DK, et al., 2019) is: 4%   Values used to calculate the score:     Age: 28 years     Sex: Female     Is Non-Hispanic African American: No     Diabetic: No     Tobacco smoker: No      Systolic Blood Pressure: 118 mmHg     Is BP treated: No     HDL Cholesterol: 54 mg/dL     Total Cholesterol: 304 mg/dL  Past Medical History:  Diagnosis Date   Anxiety    Depression    Hyperlipidemia    Mild intermittent asthma 08/29/2015   Past Surgical History:  Procedure Laterality Date   CESAREAN SECTION  11/18/1994   CHOLECYSTECTOMY Bilateral 11/18/1990   DILATION AND CURETTAGE OF UTERUS  1993, 1995   ECTOPIC PREGNANCY SURGERY  11/18/1998   EYE SURGERY  2023   Social History   Tobacco Use   Smoking status: Never   Smokeless tobacco: Never  Substance Use Topics   Alcohol use: Yes    Comment: occ   Drug use: Yes    Types: Marijuana   Family History  Problem Relation Age of Onset   Asthma Mother    Allergic rhinitis Mother    Stroke Mother    CAD Mother    Hypertension Mother    Parkinson's disease Mother    Dementia Mother    Liver cancer Father    Stroke Father    CAD Father    Hypertension Father    Breast cancer Sister    Parkinson's disease Sister    Asthma Brother    Colon polyps Brother    Hypertension Brother    Depression Brother    Colon cancer Brother  Asthma Son    Allergies  Allergen Reactions   Almond Oil Shortness Of Breath   Apple Anaphylaxis   Celery Oil Anaphylaxis   Cherry Anaphylaxis   Corylus Shortness Of Breath   Kiwi Extract Anaphylaxis   Pear Anaphylaxis   Pineapple Anaphylaxis   Prunus Persica Anaphylaxis   Amoxicillin  Diarrhea   Donnatal [Belladonna Alk-Phenobarb Er]    Lamotrigine    Penicillins    Phenobarbital-Belladonna Alk    Contrast Media [Iodinated Contrast Media]     Described subjective dyspnea in setting of severe anxiety and have low suspicion for anaphylactic reaction. Improved without epinephrine . Tryptase level sent 10/23.     ROS    Objective:    BP 118/68   Pulse 78   Temp 98.5 F (36.9 C)   Ht 5\' 4"  (1.626 m)   Wt 148 lb 8 oz (67.4 kg)   LMP 06/01/2014   SpO2 99%   BMI 25.49 kg/m   BP Readings from Last 3 Encounters:  03/26/24 118/68  09/09/21 116/75  02/05/20 103/72   Wt Readings from Last 3 Encounters:  03/26/24 148 lb 8 oz (67.4 kg)  09/08/21 145 lb (65.8 kg)  02/05/20 140 lb (63.5 kg)    Physical Exam Vitals and nursing note reviewed.  Constitutional:      Appearance: Normal appearance.  HENT:     Head: Normocephalic.     Right Ear: Tympanic membrane, ear canal and external ear normal.     Left Ear: Tympanic membrane, ear canal and external ear normal.  Eyes:     Extraocular Movements: Extraocular movements intact.     Conjunctiva/sclera: Conjunctivae normal.     Pupils: Pupils are equal, round, and reactive to light.  Neck:     Comments: Swelling noted over left supraclavicular area.  Cardiovascular:     Rate and Rhythm: Normal rate and regular rhythm.     Heart sounds: Normal heart sounds.  Pulmonary:     Effort: Pulmonary effort is normal.     Breath sounds: Normal breath sounds.  Musculoskeletal:     Right lower leg: No edema.     Left lower leg: No edema.  Neurological:     General: No focal deficit present.     Mental Status: She is alert and oriented to person, place, and time.  Psychiatric:        Attention and Perception: Attention normal.        Mood and Affect: Mood is anxious.        Speech: Speech normal.        Behavior: Behavior normal.        Thought Content: Thought content normal.        Judgment: Judgment normal.      Results for orders placed or performed in visit on 03/26/24  HM PAP SMEAR  Result Value Ref Range   HM Pap smear normal   HIV Antibody (routine testing w rflx)  Result Value Ref Range   HIV 1&2 Ab, 4th Generation nonreactive   HM HEPATITIS C SCREENING LAB  Result Value Ref Range   HM Hepatitis Screen Negative-Validated

## 2024-03-31 ENCOUNTER — Encounter: Payer: Self-pay | Admitting: Family Medicine

## 2024-03-31 ENCOUNTER — Ambulatory Visit (INDEPENDENT_AMBULATORY_CARE_PROVIDER_SITE_OTHER): Admitting: Family Medicine

## 2024-03-31 ENCOUNTER — Ambulatory Visit: Payer: Self-pay

## 2024-03-31 VITALS — BP 118/70 | HR 73 | Temp 98.4°F | Ht 64.0 in

## 2024-03-31 DIAGNOSIS — B009 Herpesviral infection, unspecified: Secondary | ICD-10-CM

## 2024-03-31 DIAGNOSIS — E785 Hyperlipidemia, unspecified: Secondary | ICD-10-CM | POA: Diagnosis not present

## 2024-03-31 DIAGNOSIS — Z0001 Encounter for general adult medical examination with abnormal findings: Secondary | ICD-10-CM | POA: Diagnosis not present

## 2024-03-31 DIAGNOSIS — Z202 Contact with and (suspected) exposure to infections with a predominantly sexual mode of transmission: Secondary | ICD-10-CM

## 2024-03-31 DIAGNOSIS — K5900 Constipation, unspecified: Secondary | ICD-10-CM | POA: Insufficient documentation

## 2024-03-31 DIAGNOSIS — Z0000 Encounter for general adult medical examination without abnormal findings: Secondary | ICD-10-CM

## 2024-03-31 MED ORDER — ROSUVASTATIN CALCIUM 10 MG PO TABS: 10.0000 mg | ORAL_TABLET | Freq: Every day | ORAL | 1 refills | Status: AC

## 2024-03-31 NOTE — Progress Notes (Unsigned)
 Complete physical exam  Assessment & Plan:    Routine Health Maintenance and Physical Exam Discussed health benefits of physical activity, and encouraged her to engage in regular exercise appropriate for her age and condition.  Preventative health care  Constipation, unspecified constipation type -     Ambulatory referral to Gastroenterology  Possible exposure to STI -     Hepatitis B surface antigen -     RPR -     HSV 2 antibody, IgG -     HIV Antibody (routine testing w rflx) -     Hepatitis C antibody  Hyperlipidemia, unspecified hyperlipidemia type -     Rosuvastatin Calcium; Take 1 tablet (10 mg total) by mouth daily.  Dispense: 90 tablet; Refill: 1  Herpes simplex type 1 infection -     valACYclovir HCl; Take 1 tablet (500 mg total) by mouth 2 (two) times daily.  Dispense: 60 tablet; Refill: 1   Fiber information given and reviewed.  GI consult ordered with Dr. Andra Banco in [facility].  Patient declined Shingrix vaccine today Recommend healthy diet i.e mediterranean/DASH diet, consistent exercise - 30 minutes 5 day per week, and gradual weight loss. Return in about 3 months (around 07/01/2024), or if symptoms worsen or fail to improve.        Subjective:  Patient ID: Robin Trevino, female    DOB: Aug 07, 1961  Age: 63 y.o. MRN: [id number] Chief Complaint  Patient presents with   Annual Exam    Robin Trevino is a 63 y.o. female who presents today for a complete physical exam. Colonoscopy-UTD per Dr. Noland Battles Shingrix-declines today STI- yes, she would like to be checked.  Pap smear- UTD October 2021, will do with OB-GYN this year Mammogram UTD Robin Trevino is a 63 year old female who presents for physical exam, management of hyperlipidemia and gastrointestinal issues.  She has significantly elevated cholesterol levels, with a total cholesterol of 304 mg/dL and an LDL of 623 mg/dL. She has not previously tried cholesterol-lowering medications and  is concerned about potential interactions with her current psychiatric medications. There is a family history of heart disease, with both parents affected and a sister who passed away from a heart attack in 2023.  She experiences chronic constipation, describing her bowel movements as 'pebbles' and requiring significant effort to pass. Despite daily bowel movements, she often feels incomplete evacuation and reports occasional bleeding. She maintains a diet rich in fruits and vegetables, avoids processed foods, and consumes red meat infrequently. She has previously been evaluated for mild colitis.  She is sexually active with one partner who has multiple partners and does not use protection. She has a history of herpes simplex virus type 1, which causes cold sores. Has concerns regarding HIV or sexually transmitted infections. Results LABS Cholesterol: 304 mg/dL LDL: 762 mg/dL Assessment & Plan Chronic Constipation Chronic constipation with hard stools, incomplete evacuation, and straining. Occasional rectal bleeding. Previous mild colitis diagnosis. Referral needed for further evaluation.  Would like to see previous GI doctor- Dr. Andra Banco in [facility] - Refer to gastroenterologist for constipation and possible colitis evaluation.  Hyperlipidemia Severe hyperlipidemia with total cholesterol 304 mg/dL, LDL 831 mg/dL. Family history of heart disease. Discussed Crestor initiation to reduce cholesterol and prevent cardiovascular events. Reassured no interaction with psychiatric medications. - Initiate Crestor for cholesterol management. - Recheck lipid panel in 3 months. 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 The 10-year ASCVD risk score (Arnett DK, et al.,  2019) is: 4%   Values used to calculate the score:     Age: 63 years     Sex: Female     Is Non-Hispanic African American: No     Diabetic: No     Tobacco smoker: No     Systolic Blood Pressure: 118  mmHg     Is BP treated: No     HDL Cholesterol: 54 mg/dL     Total Cholesterol: 304 mg/dL  Health Maintenance  Topic Date Due   COVID-19 Vaccine (1 - 2024-25 season) 04/11/2024*   Zoster (Shingles) Vaccine (1 of 2) 07/01/2024*   Pneumococcal Vaccination (1 of 2 - PCV) 03/31/2025*   Flu Shot  06/18/2024   Pap with HPV screening  09/16/2024   Mammogram  06/10/2025   DTaP/Tdap/Td vaccine (3 - Td or Tdap) 02/12/2027   Colon Cancer Screening  06/16/2030   Hepatitis C Screening  Completed   HIV Screening  Completed   HPV Vaccine  Aged Out   Meningitis B Vaccine  Aged Out  *Topic was postponed. The date shown is not the original due date.    Most recent fall risk assessment:     No data to display           Most recent depression screenings:    03/26/2024    9:49 AM  PHQ 2/9 Scores  PHQ - 2 Score 3  PHQ- 9 Score 18        Past Surgical History:  Procedure Laterality Date   CESAREAN SECTION  11/18/1994   CHOLECYSTECTOMY Bilateral 11/18/1990   DILATION AND CURETTAGE OF UTERUS  1993, 1995   ECTOPIC PREGNANCY SURGERY  11/18/1998   EYE SURGERY  2023   Social History   Tobacco Use   Smoking status: Never   Smokeless tobacco: Never  Substance Use Topics   Alcohol use: Yes    Comment: occ   Drug use: Yes    Types: Marijuana   Social History   Socioeconomic History   Marital status: Single    Spouse name: Not on file   Number of children: Not on file   Years of education: Not on file   Highest education level: Not on file  Occupational History   Not on file  Tobacco Use   Smoking status: Never   Smokeless tobacco: Never  Substance and Sexual Activity   Alcohol use: Yes    Comment: occ   Drug use: Yes    Types: Marijuana   Sexual activity: Never    Birth control/protection: None  Other Topics Concern   Not on file  Social History Narrative   Patient is not working out right now but will restart. Patient doing free lance work right now.    Social  Drivers of Corporate investment banker Strain: Not on file  Food Insecurity: Not on file  Transportation Needs: Not on file  Physical Activity: Not on file  Stress: Not on file  Social Connections: Not on file  Intimate Partner Violence: Not on file   Family History  Problem Relation Age of Onset   Asthma Mother    Allergic rhinitis Mother    Stroke Mother    CAD Mother    Hypertension Mother    Parkinson's disease Mother    Dementia Mother    Liver cancer Father    Stroke Father    CAD Father    Hypertension Father    Parkinson's disease Sister    Heart attack  Sister    Asthma Brother    Colon polyps Brother    Hypertension Brother    Depression Brother    Colon cancer Brother    Asthma Son    Allergies  Allergen Reactions   Almond Oil Shortness Of Breath   Apple Anaphylaxis   Celery Oil Anaphylaxis   Cherry Anaphylaxis   Corylus Shortness Of Breath   Kiwi Extract Anaphylaxis   Pear Anaphylaxis   Pineapple Anaphylaxis   Prunus Persica Anaphylaxis   Amoxicillin  Diarrhea   Donnatal [Belladonna Alk-Phenobarb Er]    Lamotrigine    Penicillins    Phenobarbital-Belladonna Alk    Contrast Media [Iodinated Contrast Media]     Described subjective dyspnea in setting of severe anxiety and have low suspicion for anaphylactic reaction. Improved without epinephrine . Tryptase level sent 10/23.      Patient Care Team: Amadeo June, MD as PCP - General (Family Medicine)   Outpatient Medications Prior to Visit  Medication Sig   Albuterol  Sulfate (PROAIR  RESPICLICK) 108 (90 BASE) MCG/ACT AEPB Inhale 2 puffs into the lungs every 6 (six) hours.   busPIRone (BUSPAR) 10 MG tablet Take 20 mg by mouth 2 (two) times daily.   citalopram (CELEXA) 40 MG tablet Take 40 mg by mouth daily.   cyanocobalamin (VITAMIN B12) 1000 MCG tablet Take 1,000 mcg by mouth daily.   EPINEPHrine  (EPIPEN  2-PAK) 0.3 mg/0.3 mL IJ SOAJ injection Inject 0.3 mLs (0.3 mg total) into the muscle once.    fluticasone (FLONASE) 50 MCG/ACT nasal spray Place 1 spray into both nostrils daily.   MAGNESIUM GLYCINATE PO Take 400 mg by mouth daily.   OVER THE COUNTER MEDICATION Nutrafol- OTC   VITAMIN D PO Take 1,000 Int'l Units/day by mouth daily.   [DISCONTINUED] valACYclovir (VALTREX) 500 MG tablet Take 500 mg by mouth as needed.   No facility-administered medications prior to visit.    ROS     Objective:    BP 118/70   Pulse 73   Temp 98.4 F (36.9 C)   Ht 5\' 4"  (1.626 m)   LMP 06/01/2014   SpO2 98%   BMI 25.49 kg/m  BP Readings from Last 3 Encounters:  03/31/24 118/70  03/26/24 118/68  09/09/21 116/75   Wt Readings from Last 3 Encounters:  03/26/24 148 lb 8 oz (67.4 kg)  09/08/21 145 lb (65.8 kg)  02/05/20 140 lb (63.5 kg)    Physical Exam Vitals and nursing note reviewed.  Constitutional:      Appearance: Normal appearance.  HENT:     Head: Normocephalic.     Jaw: Tenderness and pain on movement present.     Right Ear: Tympanic membrane, ear canal and external ear normal.     Left Ear: Tympanic membrane, ear canal and external ear normal.  Eyes:     Extraocular Movements: Extraocular movements intact.     Conjunctiva/sclera: Conjunctivae normal.     Pupils: Pupils are equal, round, and reactive to light.  Cardiovascular:     Rate and Rhythm: Normal rate and regular rhythm.     Heart sounds: Normal heart sounds.  Pulmonary:     Effort: Pulmonary effort is normal.     Breath sounds: Normal breath sounds.  Chest:  Breasts:    Right: Normal. No inverted nipple, mass, nipple discharge or tenderness.     Left: Normal. No inverted nipple, mass, nipple discharge or tenderness.  Abdominal:     General: Bowel sounds are normal.     Tenderness:  There is no abdominal tenderness. There is no rebound.  Musculoskeletal:     Right lower leg: No edema.     Left lower leg: No edema.     Comments: Swelling noted over left supraclavicular area, no change compared to last time.    Neurological:     General: No focal deficit present.     Mental Status: She is alert and oriented to person, place, and time. Mental status is at baseline.     Cranial Nerves: Cranial nerves 2-12 are intact.     Sensory: Sensation is intact.     Coordination: Coordination is intact. Romberg sign negative. Finger-Nose-Finger Test normal.     Gait: Gait is intact. Gait normal.     Deep Tendon Reflexes: Reflexes are normal and symmetric.  Psychiatric:        Attention and Perception: Attention normal.        Mood and Affect: Mood is anxious.        Speech: Speech normal.        Behavior: Behavior normal.        Thought Content: Thought content normal.        Cognition and Memory: Cognition normal.        Judgment: Judgment normal.      Results for orders placed or performed in visit on 03/31/24  Hepatitis B surface antigen  Result Value Ref Range   Hepatitis B Surface Ag NON-REACTIVE NON-REACTIVE  RPR  Result Value Ref Range   RPR Ser Ql NON-REACTIVE NON-REACTIVE  HIV Antibody (routine testing w rflx)  Result Value Ref Range   HIV 1&2 Ab, 4th Generation NON-REACTIVE NON-REACTIVE  Hepatitis C antibody  Result Value Ref Range   Hepatitis C Ab NON-REACTIVE NON-REACTIVE        Amadeo June, MD

## 2024-04-02 ENCOUNTER — Ambulatory Visit: Payer: Self-pay | Admitting: Family Medicine

## 2024-04-02 DIAGNOSIS — B009 Herpesviral infection, unspecified: Secondary | ICD-10-CM | POA: Insufficient documentation

## 2024-04-02 LAB — HEPATITIS B SURFACE ANTIGEN: Hepatitis B Surface Ag: NONREACTIVE

## 2024-04-02 LAB — RPR: RPR Ser Ql: NONREACTIVE

## 2024-04-02 LAB — HSV 2 ANTIBODY, IGG: HSV 2 Glycoprotein G Ab, IgG: <0.9 {index}

## 2024-04-02 LAB — HEPATITIS C ANTIBODY: Hepatitis C Ab: NONREACTIVE

## 2024-04-02 LAB — HIV ANTIBODY (ROUTINE TESTING W REFLEX): HIV 1&2 Ab, 4th Generation: NONREACTIVE

## 2024-04-02 MED ORDER — VALACYCLOVIR HCL 500 MG PO TABS: 500.0000 mg | ORAL_TABLET | Freq: Two times a day (BID) | ORAL | 1 refills | Status: DC

## 2024-05-24 ENCOUNTER — Emergency Department (HOSPITAL_BASED_OUTPATIENT_CLINIC_OR_DEPARTMENT_OTHER)
Admission: EM | Admit: 2024-05-24 | Discharge: 2024-05-25 | Disposition: A | Discharge status: Home / Self Care | Attending: Emergency Medicine | Admitting: Emergency Medicine

## 2024-05-24 ENCOUNTER — Emergency Department (HOSPITAL_BASED_OUTPATIENT_CLINIC_OR_DEPARTMENT_OTHER)

## 2024-05-24 ENCOUNTER — Other Ambulatory Visit: Payer: Self-pay

## 2024-05-24 ENCOUNTER — Encounter (HOSPITAL_BASED_OUTPATIENT_CLINIC_OR_DEPARTMENT_OTHER): Payer: Self-pay | Admitting: Emergency Medicine

## 2024-05-24 DIAGNOSIS — R519 Headache, unspecified: Secondary | ICD-10-CM | POA: Diagnosis not present

## 2024-05-24 DIAGNOSIS — R072 Precordial pain: Secondary | ICD-10-CM | POA: Insufficient documentation

## 2024-05-24 DIAGNOSIS — R079 Chest pain, unspecified: Secondary | ICD-10-CM | POA: Diagnosis present

## 2024-05-24 LAB — BASIC METABOLIC PANEL WITH GFR
Anion gap: 14 (ref 5–15)
BUN: 20 mg/dL (ref 6–20)
CO2: 20 mmol/L — ABNORMAL LOW (ref 22–32)
Calcium: 9.3 mg/dL (ref 8.9–10.3)
Chloride: 104 mmol/L (ref 98–111)
Creatinine, Ser: 0.7 mg/dL (ref 0.44–1.00)
GFR, Estimated: >60 mL/min (ref >60)
Glucose, Bld: 107 mg/dL — ABNORMAL HIGH (ref 70–99)
Potassium: 3.9 mmol/L (ref 3.5–5.1)
Sodium: 138 mmol/L (ref 135–145)

## 2024-05-24 LAB — CBC
HCT: 36.4 % (ref 36.0–46.0)
Hemoglobin: 12.7 g/dL (ref 12.0–15.0)
MCH: 31.9 pg (ref 26.0–34.0)
MCHC: 34.9 g/dL (ref 30.0–36.0)
MCV: 91.5 fL (ref 80.0–100.0)
Platelets: 273 K/uL (ref 150–400)
RBC: 3.98 MIL/uL (ref 3.87–5.11)
RDW: 12.7 % (ref 11.5–15.5)
WBC: 8.8 K/uL (ref 4.0–10.5)
nRBC: 0 % (ref 0.0–0.2)

## 2024-05-24 LAB — TROPONIN T, HIGH SENSITIVITY: Troponin T High Sensitivity: <15 ng/L (ref <19)

## 2024-05-24 NOTE — ED Triage Notes (Signed)
 Pt POV c/o lung pain, and nausea since this AM. Denies known injury. Pain worse with movement  Denies smoking.

## 2024-05-25 ENCOUNTER — Other Ambulatory Visit: Payer: Self-pay

## 2024-05-25 ENCOUNTER — Emergency Department (HOSPITAL_BASED_OUTPATIENT_CLINIC_OR_DEPARTMENT_OTHER)

## 2024-05-25 LAB — TROPONIN T, HIGH SENSITIVITY: Troponin T High Sensitivity: <15 ng/L (ref <19)

## 2024-05-25 LAB — D-DIMER, QUANTITATIVE: D-Dimer, Quant: 0.38 ug{FEU}/mL (ref 0.00–0.50)

## 2024-05-25 MED ORDER — MAGNESIUM SULFATE 2 GM/50ML IV SOLN
2.0000 g | Freq: Once | INTRAVENOUS | Status: AC
  Administered 2024-05-25: 2 g via INTRAVENOUS
  Filled 2024-05-25: qty 50

## 2024-05-25 MED ORDER — METHOCARBAMOL 1000 MG/10ML IJ SOLN
1000.0000 mg | Freq: Once | INTRAMUSCULAR | Status: AC
  Administered 2024-05-25: 1000 mg via INTRAVENOUS
  Filled 2024-05-25: qty 10

## 2024-05-25 MED ORDER — KETOROLAC TROMETHAMINE 30 MG/ML IJ SOLN
30.0000 mg | Freq: Once | INTRAMUSCULAR | Status: AC
  Administered 2024-05-25: 30 mg via INTRAVENOUS
  Filled 2024-05-25: qty 1

## 2024-05-25 NOTE — ED Provider Notes (Signed)
 [place] [facility] Provider Note   CSN: [id number] Arrival date & time: 05/24/24  2003     Patient presents with: Chest Pain   Robin Trevino is a 63 y.o. female.   The history is provided by the patient.  Chest Pain Pain location:  L chest Pain quality: dull   Pain radiates to:  Does not radiate Pain severity:  Severe Onset quality:  Sudden Duration:  1 day Timing:  Constant Progression:  Unchanged Chronicity:  New Context comment:  Started while straining to use the bathroom this am. Relieved by:  Nothing Worsened by:  Nothing Ineffective treatments:  None tried Associated symptoms: no anorexia, no cough, no fever, no palpitations, no shortness of breath, no vomiting and no weakness   Risk factors: no aortic disease and not female   Patient also has had an ongoing headache since starting crestor  3 months ago.  She has not spoken to her physician regarding this.  No exertional pain.  No DOE. No SOB. No n/v/d.  No travel nor leg pain.       Prior to Admission medications   Medication Sig Start Date End Date Taking? Authorizing Provider  Albuterol  Sulfate (PROAIR  RESPICLICK) 108 (90 BASE) MCG/ACT AEPB Inhale 2 puffs into the lungs every 6 (six) hours. 08/29/15   Asa Aloysius LABOR, MD  busPIRone (BUSPAR) 10 MG tablet Take 20 mg by mouth 2 (two) times daily.    [provider]  citalopram (CELEXA) 40 MG tablet Take 40 mg by mouth daily. 03/25/24   [provider]  cyanocobalamin (VITAMIN B12) 1000 MCG tablet Take 1,000 mcg by mouth daily.    [provider]  EPINEPHrine  (EPIPEN  2-PAK) 0.3 mg/0.3 mL IJ SOAJ injection Inject 0.3 mLs (0.3 mg total) into the muscle once. 08/29/15   Bardelas, Jose A, MD  fluticasone (FLONASE) 50 MCG/ACT nasal spray Place 1 spray into both nostrils daily.    [provider]  MAGNESIUM  GLYCINATE PO Take 400 mg by mouth daily.    [provider]  OVER THE COUNTER  MEDICATION Nutrafol- OTC    [provider]  rosuvastatin  (CRESTOR ) 10 MG tablet Take 1 tablet (10 mg total) by mouth daily. 03/31/24   Aletha Bene, MD  valACYclovir  (VALTREX ) 500 MG tablet Take 1 tablet (500 mg total) by mouth 2 (two) times daily. 04/02/24   Aletha Bene, MD  VITAMIN D  PO Take 1,000 Int'l Units/day by mouth daily.    [provider]    Allergies: Almond oil, Apple, Celery oil, Cherry, Corylus, Kiwi extract, Pear, Pineapple, Prunus persica, Amoxicillin , Donnatal [belladonna alk-phenobarb er], Lamotrigine, Penicillins, Phenobarbital-belladonna alk, and Contrast media [iodinated contrast media]    Review of Systems  Constitutional:  Negative for fever.  HENT:  Negative for facial swelling.   Respiratory:  Negative for cough and shortness of breath.   Cardiovascular:  Positive for chest pain. Negative for palpitations.  Gastrointestinal:  Negative for anorexia and vomiting.  Neurological:  Negative for weakness.  All other systems reviewed and are negative.   Updated Vital Signs BP (!) 113/58   Pulse 84   Temp 98.3 F (36.8 C) (Oral)   Resp 20   Ht 5' 4 (1.626 m)   Wt 68 kg   LMP 06/01/2014   SpO2 97%   BMI 25.75 kg/m   Physical Exam Vitals and nursing note reviewed.  Constitutional:      General: She is not in acute distress.  Appearance: Normal appearance. She is well-developed.  HENT:     Head: Normocephalic and atraumatic.     Nose: Nose normal.     Mouth/Throat:     Mouth: Mucous membranes are moist.  Eyes:     Extraocular Movements: Extraocular movements intact.     Pupils: Pupils are equal, round, and reactive to light.  Cardiovascular:     Rate and Rhythm: Normal rate and regular rhythm.     Pulses: Normal pulses.     Heart sounds: Normal heart sounds.  Pulmonary:     Effort: Pulmonary effort is normal. No respiratory distress.     Breath sounds: Normal breath sounds.  Abdominal:     General: Bowel sounds are normal.  There is no distension.     Palpations: Abdomen is soft.     Tenderness: There is no abdominal tenderness. There is no guarding or rebound.  Musculoskeletal:        General: Normal range of motion.     Cervical back: Normal range of motion and neck supple. No rigidity.  Lymphadenopathy:     Cervical: No cervical adenopathy.  Skin:    General: Skin is warm and dry.     Capillary Refill: Capillary refill takes less than 2 seconds.     Findings: No erythema or rash.  Neurological:     General: No focal deficit present.     Mental Status: She is alert and oriented to person, place, and time.     Deep Tendon Reflexes: Reflexes normal.  Psychiatric:        Mood and Affect: Mood normal.     (all labs ordered are listed, but only abnormal results are displayed) Results for orders placed or performed during the hospital encounter of 05/24/24  Basic metabolic panel   Collection Time: 05/24/24  8:16 PM  Result Value Ref Range   Sodium 138 135 - 145 mmol/L   Potassium 3.9 3.5 - 5.1 mmol/L   Chloride 104 98 - 111 mmol/L   CO2 20 (L) 22 - 32 mmol/L   Glucose, Bld 107 (H) 70 - 99 mg/dL   BUN 20 6 - 20 mg/dL   Creatinine, Ser 9.29 0.44 - 1.00 mg/dL   Calcium  9.3 8.9 - 10.3 mg/dL   GFR, Estimated >39 >39 mL/min   Anion gap 14 5 - 15  CBC   Collection Time: 05/24/24  8:16 PM  Result Value Ref Range   WBC 8.8 4.0 - 10.5 K/uL   RBC 3.98 3.87 - 5.11 MIL/uL   Hemoglobin 12.7 12.0 - 15.0 g/dL   HCT 63.5 63.9 - 53.9 %   MCV 91.5 80.0 - 100.0 fL   MCH 31.9 26.0 - 34.0 pg   MCHC 34.9 30.0 - 36.0 g/dL   RDW 87.2 88.4 - 84.4 %   Platelets 273 150 - 400 K/uL   nRBC 0.0 0.0 - 0.2 %  Troponin T, High Sensitivity   Collection Time: 05/24/24  8:16 PM  Result Value Ref Range   Troponin T High Sensitivity <15 <19 ng/L  D-dimer, quantitative   Collection Time: 05/24/24 11:56 PM  Result Value Ref Range   D-Dimer, Quant 0.38 0.00 - 0.50 ug/mL-FEU  Troponin T, High Sensitivity   Collection Time:  05/24/24 11:56 PM  Result Value Ref Range   Troponin T High Sensitivity <15 <19 ng/L   CT Head Wo Contrast Result Date: 05/25/2024 CLINICAL DATA:  Syncope/presyncope, cerebrovascular cause suspected EXAM: CT HEAD WITHOUT CONTRAST TECHNIQUE: Contiguous axial  images were obtained from the base of the skull through the vertex without intravenous contrast. RADIATION DOSE REDUCTION: This exam was performed according to the departmental dose-optimization program which includes automated exposure control, adjustment of the mA and/or kV according to patient size and/or use of iterative reconstruction technique. COMPARISON:  CT head September 08, 2021. FINDINGS: Brain: No evidence of acute infarction, hemorrhage, hydrocephalus, extra-axial collection or mass lesion/mass effect. Vascular: No hyperdense vessel. Skull: No acute fracture. Sinuses/Orbits: Clear sinuses.  No acute orbital findings. Other: No mastoid effusions. IMPRESSION: No evidence of acute intracranial abnormality. Electronically Signed   By: Gilmore GORMAN Molt M.D.   On: 05/25/2024 01:09   DG Chest 2 View Result Date: 05/24/2024 CLINICAL DATA:  Chest pain.  Pain radiates to the back. EXAM: CHEST - 2 VIEW COMPARISON:  09/08/2021 FINDINGS: Heart size and pulmonary vascularity are normal. Lungs are clear. No pleural effusion or pneumothorax. Mediastinal contours appear intact. Surgical clips in the right upper quadrant. IMPRESSION: No active cardiopulmonary disease. Electronically Signed   By: Elsie Gravely M.D.   On: 05/24/2024 20:54    EKG:  Date: 05/25/2024  Rate: 70  Rhythm: normal sinus rhythm  QRS Axis: normal  Intervals: normal  ST/T Wave abnormalities: normal  Conduction Disutrbances: none  Narrative Interpretation: unremarkable      Radiology: CT Head Wo Contrast Result Date: 05/25/2024 CLINICAL DATA:  Syncope/presyncope, cerebrovascular cause suspected EXAM: CT HEAD WITHOUT CONTRAST TECHNIQUE: Contiguous axial images were obtained  from the base of the skull through the vertex without intravenous contrast. RADIATION DOSE REDUCTION: This exam was performed according to the departmental dose-optimization program which includes automated exposure control, adjustment of the mA and/or kV according to patient size and/or use of iterative reconstruction technique. COMPARISON:  CT head September 08, 2021. FINDINGS: Brain: No evidence of acute infarction, hemorrhage, hydrocephalus, extra-axial collection or mass lesion/mass effect. Vascular: No hyperdense vessel. Skull: No acute fracture. Sinuses/Orbits: Clear sinuses.  No acute orbital findings. Other: No mastoid effusions. IMPRESSION: No evidence of acute intracranial abnormality. Electronically Signed   By: Gilmore GORMAN Molt M.D.   On: 05/25/2024 01:09   DG Chest 2 View Result Date: 05/24/2024 CLINICAL DATA:  Chest pain.  Pain radiates to the back. EXAM: CHEST - 2 VIEW COMPARISON:  09/08/2021 FINDINGS: Heart size and pulmonary vascularity are normal. Lungs are clear. No pleural effusion or pneumothorax. Mediastinal contours appear intact. Surgical clips in the right upper quadrant. IMPRESSION: No active cardiopulmonary disease. Electronically Signed   By: Elsie Gravely M.D.   On: 05/24/2024 20:54     Procedures   Medications Ordered in the ED  ketorolac  (TORADOL ) 30 MG/ML injection 30 mg (30 mg Intravenous Given 05/25/24 0016)  magnesium  sulfate IVPB 2 g 50 mL (0 g Intravenous Stopped 05/25/24 0041)  methocarbamol  (ROBAXIN ) injection 1,000 mg (1,000 mg Intravenous Given by Other 05/25/24 0018)                                    Medical Decision Making Patient with left sided chest pain since straining   Amount and/or Complexity of Data Reviewed External Data Reviewed: notes.    Details: Previous notes reviewed  Labs: ordered.    Details: 2 negative troponins <15.  Normal sodium 138, normal potassium 3.9, normal creatinine 0.7, norma ddimer 0.38. normal white count 8.8  Radiology:  ordered and independent interpretation performed.    Details: Normal CT head  Risk Prescription drug management. Risk Details: Ruled out for MI in the ED, heart score is 2 low risk for MACE.  Negative Dddimer in a low risk patient.  Head CT is normal.  This is likely MSK given the history.  Stable for discharge with close follow up.  Strict returns.       Final diagnoses:  Precordial pain  No signs of systemic illness or infection. The patient is nontoxic-appearing on exam and vital signs are within normal limits.  I have reviewed the triage vital signs and the nursing notes. Pertinent labs & imaging results that were available during my care of the patient were reviewed by me and considered in my medical decision making (see chart for details). After history, exam, and medical workup I feel the patient has been appropriately medically screened and is safe for discharge home. Pertinent diagnoses were discussed with the patient. Patient was given return precautions.     ED Discharge Orders     None          Cairo Lingenfelter, MD 05/25/24 9852

## 2024-05-26 ENCOUNTER — Ambulatory Visit: Payer: Self-pay

## 2024-05-26 ENCOUNTER — Encounter: Payer: Self-pay | Admitting: Family Medicine

## 2024-05-26 ENCOUNTER — Ambulatory Visit: Admitting: Family Medicine

## 2024-05-26 VITALS — BP 128/78 | HR 87 | Temp 98.2°F | Wt 151.2 lb

## 2024-05-26 DIAGNOSIS — R072 Precordial pain: Secondary | ICD-10-CM | POA: Diagnosis not present

## 2024-05-26 DIAGNOSIS — J029 Acute pharyngitis, unspecified: Secondary | ICD-10-CM | POA: Insufficient documentation

## 2024-05-26 NOTE — Assessment & Plan Note (Addendum)
 Unchanged from ED, thorough workup done ruling out PNA, PE, MI. Discussed when to seek emergent medical care. Presumed MSK, advised ibuprofen and rest, follow up with PCP if symptoms persist or worsen.

## 2024-05-26 NOTE — Telephone Encounter (Signed)
 Copied from CRM [id number]. Topic: Clinical - Red Word Triage >> May 26, 2024 10:03 AM Essie A wrote: Red Word that prompted transfer to Nurse Triage: Swollen throat for 2 days    FYI Only or Action Required?: FYI only for provider.  Patient was last seen in primary care on 03/31/2024 by Aletha Bene, MD.  Called Nurse Triage reporting Sore Throat.  Symptoms began several days ago.  Interventions attempted: Other: Was seen in the ED.  Symptoms are: gradually worsening.  Triage Disposition: See Physician Within 24 Hours  Patient/caregiver understands and will follow disposition?: Yes     Reason for Disposition  SEVERE throat pain (e.g., excruciating)  Protocols used: Sore Throat-A-AH  1. ONSET: When did the throat start hurting? (Hours or days ago)  3-4 days 2. SEVERITY: How bad is the sore throat? (Scale 1-10; mild, moderate or severe) - MILD (1-3): Doesn't interfere with eating or normal activities. - MODERATE (4-7): Interferes with eating some solids and normal activities. - SEVERE (8-10): Excruciating pain, interferes with most normal activities. - SEVERE WITH DYSPHAGIA (10): Can't swallow liquids, drooling. Moderate to severe  3. STREP EXPOSURE: Has there been any exposure to strep within the past week? If Yes, ask: What type of contact occurred?  No 4. VIRAL SYMPTOMS: Are there any symptoms of a cold, such as a runny nose, cough, hoarse voice or red eyes?  Hoarse voice, dry cough 5. FEVER: Do you have a fever? If Yes, ask: What is your temperature, how was it measured, and when did it start? No 6. PUS ON THE TONSILS: Is there pus on the tonsils in the back of your throat? No 7. OTHER SYMPTOMS: Do you have any other symptoms? (e.g., difficulty breathing, headache, rash) Headache

## 2024-05-26 NOTE — Progress Notes (Addendum)
 Subjective:  HPI: Robin Trevino is a 63 y.o. female presenting on 05/26/2024 for No chief complaint on file.   HPI Patient is in today for 6 days of sore throat and left chest wall pain that is posterior, worse with deep breaths in the front. Denies SOB, palpitations, lightheadedness, dizziness, diaphoresis, N/V, worsening pain. She was worked up in the ED for this precordial pain. She is also having headaches. Now experiencing sore throat with swelling. Her chest discomfort is improving, headaches are ongoing, and laryngitis with sore throat is worse.  Denies fever, chills, body aches. Has tried salt water gargling and listerine She was seen in the ED for this two days ago and had CT head, CXR, D-dimer negative, normal BMP, troponin, EKG NSR.   Review of Systems  All other systems reviewed and are negative.   Relevant past medical history reviewed and updated as indicated.   Past Medical History:  Diagnosis Date   Anxiety    Depression    Hyperlipidemia    Mild intermittent asthma 08/29/2015     Past Surgical History:  Procedure Laterality Date   CESAREAN SECTION  11/18/1994   CHOLECYSTECTOMY Bilateral 11/18/1990   DILATION AND CURETTAGE OF UTERUS  1993, 1995   ECTOPIC PREGNANCY SURGERY  11/18/1998   EYE SURGERY  2023    Allergies and medications reviewed and updated.   Current Outpatient Medications:    Albuterol  Sulfate (PROAIR  RESPICLICK) 108 (90 BASE) MCG/ACT AEPB, Inhale 2 puffs into the lungs every 6 (six) hours., Disp: 1 each, Rfl: 0   busPIRone (BUSPAR) 10 MG tablet, Take 20 mg by mouth 2 (two) times daily., Disp: , Rfl:    citalopram (CELEXA) 40 MG tablet, Take 40 mg by mouth daily., Disp: , Rfl:    cyanocobalamin (VITAMIN B12) 1000 MCG tablet, Take 1,000 mcg by mouth daily., Disp: , Rfl:    EPINEPHrine  (EPIPEN  2-PAK) 0.3 mg/0.3 mL IJ SOAJ injection, Inject 0.3 mLs (0.3 mg total) into the muscle once., Disp: 2 Device, Rfl: 1   fluticasone (FLONASE) 50  MCG/ACT nasal spray, Place 1 spray into both nostrils daily., Disp: , Rfl:    MAGNESIUM  GLYCINATE PO, Take 400 mg by mouth daily., Disp: , Rfl:    OVER THE COUNTER MEDICATION, Nutrafol- OTC, Disp: , Rfl:    rosuvastatin  (CRESTOR ) 10 MG tablet, Take 1 tablet (10 mg total) by mouth daily., Disp: 90 tablet, Rfl: 1   valACYclovir  (VALTREX ) 500 MG tablet, Take 1 tablet (500 mg total) by mouth 2 (two) times daily., Disp: 60 tablet, Rfl: 1   VITAMIN D  PO, Take 1,000 Int'l Units/day by mouth daily., Disp: , Rfl:   Allergies  Allergen Reactions   Almond Oil Shortness Of Breath   Apple Anaphylaxis   Celery Oil Anaphylaxis   Cherry Anaphylaxis   Corylus Shortness Of Breath   Kiwi Extract Anaphylaxis   Pear Anaphylaxis   Pineapple Anaphylaxis   Prunus Persica Anaphylaxis   Amoxicillin  Diarrhea   Donnatal [Belladonna Alk-Phenobarb Er]    Lamotrigine    Penicillins    Phenobarbital-Belladonna Alk    Contrast Media [Iodinated Contrast Media]     Described subjective dyspnea in setting of severe anxiety and have low suspicion for anaphylactic reaction. Improved without epinephrine . Tryptase level sent 10/23.     Objective:   BP 128/78 (BP Location: Left Arm, Patient Position: Sitting, Cuff Size: Normal)   Pulse 87   Temp 98.2 F (36.8 C) (Oral)   Wt 151 lb 3.2 oz (68.6  kg)   LMP 06/01/2014   SpO2 95%   BMI 25.95 kg/m      05/26/2024    4:08 PM 05/25/2024    1:43 AM 05/25/2024   12:45 AM  Vitals with BMI  Weight 151 lbs 3 oz    BMI 25.94    Systolic 128  114  Diastolic 78  75  Pulse 87 79 69     Physical Exam Vitals and nursing note reviewed.  Constitutional:      Appearance: Normal appearance. She is normal weight.  HENT:     Head: Normocephalic and atraumatic.     Right Ear: Tympanic membrane, ear canal and external ear normal.     Left Ear: Tympanic membrane, ear canal and external ear normal.     Nose: Nose normal.     Mouth/Throat:     Pharynx: Pharyngeal swelling,  oropharyngeal exudate and posterior oropharyngeal erythema present.  Cardiovascular:     Rate and Rhythm: Normal rate and regular rhythm.     Pulses: Normal pulses.     Heart sounds: Normal heart sounds.  Pulmonary:     Effort: Pulmonary effort is normal.     Breath sounds: Normal breath sounds.  Skin:    General: Skin is warm and dry.  Neurological:     General: No focal deficit present.     Mental Status: She is alert and oriented to person, place, and time. Mental status is at baseline.  Psychiatric:        Mood and Affect: Mood normal.        Behavior: Behavior normal.        Thought Content: Thought content normal.        Judgment: Judgment normal.     Assessment & Plan:  Pharyngitis, unspecified etiology Assessment & Plan: Negative strep, declines respiratory viral panel, will check for mono given symptoms of malaise, sore throat, and cervical lymphadenopathy. Advised likely viral and symptomatic management. Follow up if symptoms persist or worsen.   Orders: -     STREP GROUP A AG, W/REFLEX TO CULT -     Mononucleosis screen -     Culture, Group A Strep  Precordial pain Assessment & Plan: Unchanged from ED, thorough workup done ruling out PNA, PE, MI. Discussed when to seek emergent medical care. Presumed MSK, advised ibuprofen and rest, follow up with PCP if symptoms persist or worsen.       Follow up plan: Return if symptoms worsen or fail to improve.  Jeoffrey GORMAN Barrio, FNP

## 2024-05-26 NOTE — Assessment & Plan Note (Signed)
 Negative strep, declines respiratory viral panel, will check for mono given symptoms of malaise, sore throat, and cervical lymphadenopathy. Advised likely viral and symptomatic management. Follow up if symptoms persist or worsen.

## 2024-05-27 ENCOUNTER — Ambulatory Visit: Payer: Self-pay | Admitting: Family Medicine

## 2024-05-27 LAB — MONONUCLEOSIS SCREEN: Heterophile, Mono Screen: NEGATIVE

## 2024-05-28 ENCOUNTER — Telehealth: Admitting: Physician Assistant

## 2024-05-28 DIAGNOSIS — J029 Acute pharyngitis, unspecified: Secondary | ICD-10-CM

## 2024-05-28 LAB — CULTURE, GROUP A STREP
Micro Number: 16676605
SPECIMEN QUALITY:: ADEQUATE

## 2024-05-28 LAB — STREP GROUP A AG, W/REFLEX TO CULT: Streptococcus Group A AG: NOT DETECTED

## 2024-05-28 MED ORDER — NAPROXEN 500 MG PO TABS: 500.0000 mg | ORAL_TABLET | Freq: Two times a day (BID) | ORAL | 0 refills | Status: AC

## 2024-05-28 MED ORDER — LIDOCAINE VISCOUS HCL 2 % MT SOLN: OROMUCOSAL | 0 refills | Status: AC

## 2024-05-28 NOTE — Progress Notes (Signed)
 E-Visit for Sore Throat  We are sorry that you are not feeling well.  Here is how we plan to help!  Your symptoms indicate a likely viral infection (Pharyngitis).   Pharyngitis is inflammation in the back of the throat which can cause a sore throat, scratchiness and sometimes difficulty swallowing.   Pharyngitis is typically caused by a respiratory virus and will just run its course.  Please keep in mind that your symptoms could last up to 10 days.  For throat pain, we recommend over the counter oral pain relief medications such as acetaminophen  or aspirin, or anti-inflammatory medications such as ibuprofen or naproxen  sodium.  I have prescribed Naproxen  500mg  Take 1 tablet twice daily as needed for pain and swelling of the throat. Topical treatments such as oral throat lozenges or sprays may be used as needed.  I have prescribed Viscous Lidocaine  2% Swallow 5mL every 4-6 hours as needed for the sore throat. Avoid close contact with loved ones, especially the very young and elderly.  Remember to wash your hands thoroughly throughout the day as this is the number one way to prevent the spread of infection and wipe down door knobs and counters with disinfectant.  After careful review of your answers, I would not recommend an antibiotic for your condition.  Antibiotics should not be used to treat conditions that we suspect are caused by viruses like the virus that causes the common cold or flu. However, some people can have Strep with atypical symptoms. You may need formal testing in clinic or office to confirm if your symptoms continue or worsen.  Providers prescribe antibiotics to treat infections caused by bacteria. Antibiotics are very powerful in treating bacterial infections when they are used properly.  To maintain their effectiveness, they should be used only when necessary.  Overuse of antibiotics has resulted in the development of super bugs that are resistant to treatment!    Home Care: Only  take medications as instructed by your medical team. Do not drink alcohol while taking these medications. A steam or ultrasonic humidifier can help congestion.  You can place a towel over your head and breathe in the steam from hot water coming from a faucet. Avoid close contacts especially the very young and the elderly. Cover your mouth when you cough or sneeze. Always remember to wash your hands.  Get Help Right Away If: You develop worsening fever or throat pain. You develop a severe head ache or visual changes. Your symptoms persist after you have completed your treatment plan.  Make sure you Understand these instructions. Will watch your condition. Will get help right away if you are not doing well or get worse.   Thank you for choosing an e-visit.  Your e-visit answers were reviewed by a board certified advanced clinical practitioner to complete your personal care plan. Depending upon the condition, your plan could have included both over the counter or prescription medications.  Please review your pharmacy choice. Make sure the pharmacy is open so you can pick up prescription now. If there is a problem, you may contact your provider through [employer] and have the prescription routed to another pharmacy.  Your safety is important to us . If you have drug allergies check your prescription carefully.   For the next 24 hours you can use MyChart to ask questions about today's visit, request a non-urgent call back, or ask for a work or school excuse. You will get an email in the next two days asking about  your experience. I hope that your e-visit has been valuable and will speed your recovery.     I have spent 5 minutes in review of e-visit questionnaire, review and updating patient chart, medical decision making and response to patient.   Delon CHRISTELLA Dickinson, PA-C

## 2024-05-28 NOTE — Progress Notes (Signed)
 My chart letter sent with result.

## 2024-06-28 NOTE — Progress Notes (Signed)
 ------------------------------------------------------------------------------- Attestation signed by Shanna JINNY Denmark, MD at 06/28/2024  1:02 PM I agree with the a/p, rjb  -------------------------------------------------------------------------------     GASTROENTEROLOGY OUTPATIENT CONSULTATION NOTE  REFERRING PHYSICIAN: Aletha Connie HERO, MD  CC: Constipation  HISTORY OF PRESENT ILLNESS: Robin Trevino is a 63 y.o. female with a significant PMHx including personal and family h/o colon polyps, constipation, h/o colitis, and HTN who presents to the office today for further evaluation of the above and BRBPR. Their chart reviewed and last seen by Dr. Timm for below colonoscopy on 06/16/20 (see below). Pt has since noted increasing constipation for which she was seen by PCP for on 03/31/24 - pt recommended to increase her daily fiber intake with supplemental if needed and take daily stool softener at night, with GI referral placed (today's visit).   Today, patient notes longstanding issues with constipation - with increasing issues with BRBPR (small on TP w/o clots or melena), and lower abdominal bloating which improves following BM. Pt currently eating healthy diet, but hesitant towards trying new fruits particularly due to her multiple allergies - and taking nightly dulcolax. She notes small volume BSC type 1 stools on average every 2-3rd day, notes brown stools, but with BRBPR as per above maybe 1-2 times a month.  Denies having any acute upper or lower GI complaints. The patient denies having any abdominal pain. Notes nausea daily, no vomiting. Denies heartburn, dysphagia. Weight and appetite stable. No AsA/NSAID use.  Due to see her PCP on the 15 th of this month, as to f/u on her HLD  Labs 05/24/24: BMP, TSH, and CBC: WNL   Colonoscopy 06/16/20 ENDOSCOPIC IMPRESSION: 1. Normal colonoscopy; multiple biopsies were performed using cold forceps 2. Retroflexed views revealed internal Grade  II second degree hemorrhoids RECOMMENDATION: PATHOLOGY: REPEAT EXAM:  Await biopsy resultsPathology - Biopsy TakenReturn in 5 years for Colonoscopy.  The biopsies done of your colon showed some mild patchy colitis, which is inflammation. This was felt to be due to an acute self limited colitis such as what someone might get from an infection, which should resolve.    ALLERGIES: Allergies[1]  MEDICATIONS: Current Medications[2]  PAST MEDICAL HISTORY: Problem List[3]  PAST SURGICAL HISTORY: Surgical History[4]  SOCIAL HISTORY: Tobacco Use History[5] Social History   Substance and Sexual Activity  Alcohol Use Yes   Social History   Substance and Sexual Activity  Drug Use No    FAMILY HISTORY: Negative for colon cancer, colon polyps, ulcerative colitis, Crohn's disease, liver disease.  REVIEW OF SYSTEMS: A complete ROS of negative except those stated in HPI.  LABS: Pertinent labs per HPI  IMAGING:  Pertinent GI imaging per HPI  VITAL SIGNS:  Height: 1.626 m (5' 4) (06/28/2024 10:39 AM) Weight: 67.1 kg (148 lb) (06/28/2024 10:39 AM)  Body mass index is 25.4 kg/m.  Vitals:   06/28/24 1039  BP: 102/62  Pulse: 78  SpO2: 98%    PHYSICAL EXAM: Well developed, well nourished.  No acute distress.   Skin warm and dry.  HEENT: Normocephalic, atraumatic. No scleral icterus.  No oropharyngeal lesions, mucosa pink and moist. Neck: Normal to inspection. Supple. Lungs:Respiratory effort unlabored. Clear to auscultation.  Cardiac: Rhythm regular with no murmur Abdomen:  hypoactive bowel sounds,soft, nondistended, nontender  Extremities without  clubbing, cyanosis, edema.  Musculoskeletal:  No gross motor deficits. Neurological: Alert and oriented x 3. Mood and affect appropriate.   ASSESSMENT 1. Other constipation   2. Abdominal bloating   3. BRBPR (bright red blood  per rectum)   4. Colon cancer screening     Orders Placed This Encounter  Procedures  . Endo  Colon    PLAN 1.) Colonoscopy - will schedule today for pt at [facility] - advised two day bowel prep, with pt understanding of the risks and potential for need to reschedule colonoscopy if not adequately prepped for the procedure, and preferring to try the below in conjunction with diet low in fiber/fat 1-2 weeks prior to her colonoscopy.  2.) Pt will start miralax rescue plan (info provided to and explained to pt) - followed by Linzess 145 mcg 30 min before AM meal - Rx sent . Advised pt that if needed in the further we can increase this dose to 290 mcg daily, or try alternative such as Amitiza. 3.) Recommended pt to utilize [employer] as well as continuing to increase her daily fiber and water intake. 4.) Omeprazole  40 mg BID (20-30 min before AM and PM meal) - Rx sent 5.) GERD diet and lifestyle modifications reveied with pt. 6.) Will plan to f/u in 4-6 months or sooner if needed - at which point we may consider ARM study to r/o pelvic floor dysfunction.    History of pacemaker or defibrillator: No Is BMI >45: No Does patient use O2: No Is patient on anticoagulation: No Is patient on GLP-1 receptor agonist: No  Preparation: Miralax Endoscopist: Shanna DOROTHA Denmark, MD ASA: 2 Mallampati: 2 Anesthesia plan: Propofol at [facility]  I discussed the nature of the recommended colonoscopy , as well as the indications, risks, alternatives and potential complications including, but not limited to, bleeding, infection, reaction to medication, damage to internal organs, cardiac and/or pulmonary problems, and perforation requiring surgery (1 to 2 in 1000). The possibility that significant findings could be missed was explained. Any questions the patient had were answered. The patient gives consent for the procedure.  Thank you for allowing us  to participate in the care of this patient.        [1] Allergies Allergen Reactions  . Almond Shortness Of Breath  . Apple Anaphylaxis   . Celery (Apium Graveolens) (Umbelliferae) Anaphylaxis  . Cherry Anaphylaxis  . Hazelnut Shortness Of Breath  . Kiwi (Actinidia Chinensis) Anaphylaxis  . Pear Anaphylaxis  . Pineapple Anaphylaxis  . Amoxicillin  Diarrhea and GI Intolerance    Melenotic Diarrheal Stools , Abdominal Cramps  . Lamotrigine GI Intolerance  . Phenobarb-Hyoscy-Atropine-Scop GI Intolerance  [2]  Current Outpatient Medications:  .  acetaminophen  (TYLENOL ) 650 mg ER tablet, Take  by mouth., Disp: , Rfl:  .  albuterol  2.5 mg /3 mL (0.083 %) nebulizer solution, Take 2.5 mg by nebulization every 4 (four) hours as needed for wheezing., Disp: 75 mL, Rfl: 0 .  albuterol  HFA (PROVENTIL  HFA;VENTOLIN  HFA;PROAIR  HFA) 90 mcg/actuation inhaler, Inhale 2 puffs every 6 (six) hours as needed for wheezing for up to 30 days., Disp: 1 each, Rfl: 1 .  ascorbic acid (VITAMIN C) 500 mg tablet, Take 500 mg by mouth Once Daily., Disp: , Rfl:  .  busPIRone (BUSPAR) 10 mg tablet, Take 10 mg by mouth 2 (two) times a day., Disp: , Rfl:  .  cetirizine (ZyrTEC) 10 mg tablet, Take 10 mg by mouth Once Daily., Disp: , Rfl:  .  cholecalciferol 25 mcg (1,000 unit) cap, Take 1,000 Units by mouth Once Daily., Disp: , Rfl:  .  citalopram (CeleXA) 10 mg tablet, Take 10 mg by mouth Once Daily., Disp: , Rfl:  .  cyanocobalamin (VITAMIN B12) 1,000 mcg tablet, Take 1,000 mcg by mouth Once Daily., Disp: , Rfl:  .  elderberry fruit 350 mg cap, Take  by mouth., Disp: , Rfl:  .  EPINEPHrine  (EPIPEN ) 0.3 mg/0.3 mL injection syringe, Inject 0.3 mLs (0.3 mg total) into the thigh muscle as needed for Anaphylaxis. May repeat dose x 1 after 5-15 minutes, if anaphylactic symptoms do not improve. Seek medical/emergency assistance., Disp: 2 each, Rfl: 0 .  EPINEPHrine  (EPIPEN ) 0.3 mg/0.3 mL injection syringe, Inject 0.3 mL (0.3 mg total) into the thigh once for 1 dose., Disp: 0.3 mL, Rfl: 0 .  fluticasone propionate (FLONASE) 50 mcg/spray nasal spray, 1 spray Once  Daily., Disp: , Rfl:  .  linaCLOtide (LINZESS) 145 mcg cap capsule, Take one capsule on an empty stomach 30 minutes prior to first meal of the day. Keep medicine in its original container., Disp: 90 capsule, Rfl: 4 .  melatonin tablet, Take 5 mg by mouth nightly., Disp: , Rfl:  .  mometasone (NASONEX) 50 mcg/actuation spry spray, Administer 100 mcg into affected nostril(s) Once Daily., Disp: , Rfl:  .  multivitamin-Ca-iron-minerals (One Daily Calcium /Iron) tab, Take  by mouth., Disp: , Rfl:  .  olopatadine (PATANOL) 0.1 % ophthalmic solution, Administer 1 drop into each eyes 2 (two) times a day., Disp: 5 mL, Rfl: 11 .  pseudoephedrine-guaiFENesin (MUCINEX D) 60-600 mg per 12 hr tablet, Take 1 tablet by mouth every 12 (twelve) hours for 10 days., Disp: 20 tablet, Rfl: 1 .  valACYclovir  (VALTREX ) 500 mg tablet, Indications: herpes simplex infection, Disp: 30 tablet, Rfl: 0 [3] Patient Active Problem List Diagnosis  . Chronic nonseasonal allergic rhinitis due to pollen  . Allergy with anaphylaxis due to food  . Gastroesophageal reflux disease without esophagitis  . Mixed hyperlipidemia  . Major depressive disorder, recurrent episode, severe (HCC)  . Mild intermittent asthma without complication  . Migraine without aura  . Oral allergy syndrome  . Allergic reaction  [4] Past Surgical History: Procedure Laterality Date  . CESAREAN SECTION, UNSPECIFIED     Procedure: CESAREAN SECTION  . DILATION AND CURETTAGE OF UTERUS     Procedure: DILATION AND CURETTAGE OF UTERUS  . LAPAROSCOPIC CHOLECYSTECTOMY     Procedure: LAPAROSCOPIC CHOLECYSTECTOMY  [5] Social History Tobacco Use  Smoking Status Never  Smokeless Tobacco Never

## 2024-06-29 ENCOUNTER — Ambulatory Visit: Payer: Self-pay

## 2024-06-29 ENCOUNTER — Encounter: Payer: Self-pay | Admitting: Family Medicine

## 2024-06-29 ENCOUNTER — Ambulatory Visit (INDEPENDENT_AMBULATORY_CARE_PROVIDER_SITE_OTHER): Admitting: Family Medicine

## 2024-06-29 VITALS — BP 118/62 | HR 100 | Temp 101.0°F | Ht 64.0 in | Wt 149.5 lb

## 2024-06-29 DIAGNOSIS — R6889 Other general symptoms and signs: Secondary | ICD-10-CM | POA: Diagnosis not present

## 2024-06-29 DIAGNOSIS — J029 Acute pharyngitis, unspecified: Secondary | ICD-10-CM | POA: Diagnosis not present

## 2024-06-29 DIAGNOSIS — H6992 Unspecified Eustachian tube disorder, left ear: Secondary | ICD-10-CM

## 2024-06-29 LAB — INFLUENZA A AND B AG, IMMUNOASSAY
INFLUENZA A ANTIGEN: NOT DETECTED
INFLUENZA B ANTIGEN: NOT DETECTED

## 2024-06-29 NOTE — Telephone Encounter (Addendum)
 Appointment made for today 06/29/2024 at 3:20 PM with patient's PCP Dr Connie Emperor   FYI Only or Action Required?: FYI only for provider.  Patient was last seen in primary care on 05/26/2024 by Kayla Jeoffrey RAMAN, FNP.  Called Nurse Triage reporting Sore Throat.  Symptoms began yesterday.  Interventions attempted: Rest, hydration, or home remedies.  Symptoms are: gradually worsening.  Triage Disposition: See Physician Within 24 Hours  Patient/caregiver understands and will follow disposition?: Yes             Copied from CRM #[id number]. Topic: Clinical - Red Word Triage >> Jun 29, 2024  1:44 PM Uyopbjy D wrote: Feeling really sick, throat is swollen can barely swallow, light headed and dizzy it's getting worse Reason for Disposition  SEVERE throat pain (e.g., excruciating)  Answer Assessment - Initial Assessment Questions Negative home covid test Patient felt this same exact way last month when she started getting sick last month Patient states this all started yesterday and she is currently driving home from another doctor's appointment She states that she feels like she has the flu but has not had a fever that she is aware of even though she is having cold chills Patient scheduled to see her PCP today She is also advised that if anything worsens to go to the Emergency Room or call 911 Patient verbalized understanding     1. ONSET: When did the throat start hurting? (Hours or days ago)      yesterday 2. SEVERITY: How bad is the sore throat? (Scale 1-10; mild, moderate or severe)     8-9 3. STREP EXPOSURE: Has there been any exposure to strep within the past week? If Yes, ask: What type of contact occurred?      No 4.  VIRAL SYMPTOMS: Are there any symptoms of a cold, such as a runny nose, cough, hoarse voice or red eyes?      Cold chills,  5. FEVER: Do you have a fever? If Yes, ask: What is your temperature, how was it measured, and when did it  start?     No 6. PUS ON THE TONSILS: Is there pus on the tonsils in the back of your throat?     ------ 7. OTHER SYMPTOMS: Do you have any other symptoms? (e.g., difficulty breathing, headache, rash)     Headache---center to left side into ear and throat  Protocols used: Sore Throat-A-AH

## 2024-06-29 NOTE — Progress Notes (Signed)
 Patient Office Visit  Assessment & Plan:  Flu-like symptoms -     Influenza A and B Ag, Immunoassay  Sore throat -     STREP GROUP A AG, W/REFLEX TO CULT   Assessment and Plan    Acute pharyngitis with fever and headache  Acute sore throat with fever and chills, negative rapid strep and flu tests. Possible strep throat or viral pharyngitis.  - Swab throat for strep culture. - Advise naproxen  for anti-inflammatory and fever reduction. - Follow up on strep culture results. - Will Rx antibiotics if strep culture positive. - Reassess symptoms at follow-up on Friday.  Herpes labialis Cold sore present for two weeks, not fully healed. - Consider Valtrex  for herpes labialis.       Return if symptoms worsen or fail to improve.   Subjective:    Patient ID: Robin Trevino, female    DOB: [birth year]  Age: 63 y.o. MRN: [id number]  Chief Complaint  Patient presents with   Chills    Pt c/o of chills associated with sore throat, headache and blurred vision.     HPI Discussed the use of AI scribe software for clinical note transcription with the patient, who gave verbal consent to proceed.  History of Present Illness        Robin Trevino is a 63 year old female who presents with fever and sore throat started this AM. Fever started here in our office.   She experienced the onset of fever today, which was not present in the morning, along with a severe sore throat described as 'huge' and 'hard to swallow'. This episode is similar to one last month when she had a sore throat and ear pain, but no definitive diagnosis was made at that time. She has a history of a negative COVID test at home today and a previous ER visit last month where she was checked for flu and strep, both of which were negative. During that episode, she used lidocaine  gel for throat pain, which she found unpleasant but somewhat helpful. She also tried salt water gargles, honey, and lemon for relief.  She  experiences severe muscle aches, chills, and a headache affecting her vision, with floaters and a 'lazy eye' contributing to a 'surreal kind of woozy' feeling. No recent exposure to strep or flu, and her son, who is 5, is not sick. No cough, wheezing, or breathing difficulties. No diarrhea but mentions constipation.   She has a history of constipation and has been taking 'something beats capsules'. She previously stopped taking Crestor  due to concerns it might be causing diarrhea and stomach cramping. She is allergic to penicillin and amoxicillin , which cause cramping and black stools. She has previously tolerated Z-Pak well.  She has a cold sore that has not completely healed, leaving a small dot. She recalls a dog licking her mouth recently, which she cleaned immediately. Physical Exam HEENT: Throat with redness and white spots on the right side. Fluid in the ear, not red. Results LABS   Streptococcal culture: rapid strep Negative   Influenza culture: Negative A & B Assessment & Plan Acute pharyngitis with fever and headache  Acute sore throat with fever and chills, negative rapid strep and flu tests. Possible strep throat or viral pharyngitis.  - Swab throat for strep culture. - Advise naproxen  for anti-inflammatory and fever reduction. - Follow up on strep culture results. - Will Rx antibiotics if strep culture positive. - Reassess symptoms at follow-up on Friday.  Herpes labialis  Cold sore present for two weeks, not fully healed. - Consider Valtrex  for herpes labialis.     The 10-year ASCVD risk score (Arnett DK, et al., 2019) is: 4%  Past Medical History:  Diagnosis Date   Anxiety    Depression    Hyperlipidemia    Mild intermittent asthma 08/29/2015   Past Surgical History:  Procedure Laterality Date   CESAREAN SECTION  11/18/1994   CHOLECYSTECTOMY Bilateral 11/18/1990   DILATION AND CURETTAGE OF UTERUS  1993, 1995   ECTOPIC PREGNANCY SURGERY  11/18/1998   EYE  SURGERY  2023   Social History   Tobacco Use   Smoking status: Never   Smokeless tobacco: Never  Substance Use Topics   Alcohol use: Yes    Comment: occ   Drug use: Yes    Types: Marijuana   Family History  Problem Relation Age of Onset   Asthma Mother    Allergic rhinitis Mother    Stroke Mother    CAD Mother    Hypertension Mother    Parkinson's disease Mother    Dementia Mother    Liver cancer Father    Stroke Father    CAD Father    Hypertension Father    Parkinson's disease Sister    Heart attack Sister    Asthma Brother    Colon polyps Brother    Hypertension Brother    Depression Brother    Colon cancer Brother    Asthma Son    Allergies  Allergen Reactions   Almond Oil Shortness Of Breath   Apple Anaphylaxis   Celery Oil Anaphylaxis   Cherry Anaphylaxis   Corylus Shortness Of Breath   Kiwi Extract Anaphylaxis   Pear Anaphylaxis   Pineapple Anaphylaxis   Prunus Persica Anaphylaxis   Amoxicillin  Diarrhea   Donnatal [Belladonna Alk-Phenobarb Er]    Lamotrigine    Penicillins    Phenobarbital-Belladonna Alk    Contrast Media [Iodinated Contrast Media]     Described subjective dyspnea in setting of severe anxiety and have low suspicion for anaphylactic reaction. Improved without epinephrine . Tryptase level sent 10/23.     ROS    Objective:    BP 118/62   Pulse 100   Temp (!) 101 F (38.3 C)   Ht 5' 4 (1.626 m)   Wt 149 lb 8 oz (67.8 kg)   LMP 06/01/2014   SpO2 98%   BMI 25.66 kg/m  BP Readings from Last 3 Encounters:  06/29/24 118/62  05/26/24 128/78  05/25/24 114/75   Wt Readings from Last 3 Encounters:  06/29/24 149 lb 8 oz (67.8 kg)  05/26/24 151 lb 3.2 oz (68.6 kg)  05/24/24 150 lb (68 kg)    Physical Exam Vitals and nursing note reviewed.  Constitutional:      Appearance: She is ill-appearing.  HENT:     Head: Normocephalic.     Right Ear: Tympanic membrane, ear canal and external ear normal.     Left Ear: Tympanic  membrane normal.     Ears:     Comments: Left TM decreased mobility.     Mouth/Throat:     Pharynx: Oropharyngeal exudate and posterior oropharyngeal erythema present.  Eyes:     Extraocular Movements: Extraocular movements intact.     Pupils: Pupils are equal, round, and reactive to light.  Cardiovascular:     Rate and Rhythm: Regular rhythm. Tachycardia present.     Heart sounds: Normal heart sounds.  Pulmonary:     Effort: Pulmonary  effort is normal.     Breath sounds: Normal breath sounds. No wheezing.  Musculoskeletal:     Right lower leg: No edema.     Left lower leg: No edema.  Neurological:     General: No focal deficit present.     Mental Status: She is alert and oriented to person, place, and time.  Psychiatric:        Mood and Affect: Mood normal.        Behavior: Behavior normal.      No results found for any visits on 06/29/24.

## 2024-07-01 LAB — CULTURE, GROUP A STREP
Micro Number: 16819601
SPECIMEN QUALITY:: ADEQUATE

## 2024-07-01 LAB — STREP GROUP A AG, W/REFLEX TO CULT: Streptococcus Group A AG: NOT DETECTED

## 2024-07-02 ENCOUNTER — Ambulatory Visit: Payer: Self-pay | Admitting: Family Medicine

## 2024-07-02 ENCOUNTER — Encounter: Payer: Self-pay | Admitting: Family Medicine

## 2024-07-02 ENCOUNTER — Ambulatory Visit: Admitting: Family Medicine

## 2024-07-02 ENCOUNTER — Encounter (INDEPENDENT_AMBULATORY_CARE_PROVIDER_SITE_OTHER): Payer: Self-pay

## 2024-07-02 VITALS — BP 124/70 | HR 91 | Temp 98.5°F | Ht 64.0 in | Wt 146.1 lb

## 2024-07-02 DIAGNOSIS — K121 Other forms of stomatitis: Secondary | ICD-10-CM

## 2024-07-02 DIAGNOSIS — J0391 Acute recurrent tonsillitis, unspecified: Secondary | ICD-10-CM | POA: Diagnosis not present

## 2024-07-02 DIAGNOSIS — R6889 Other general symptoms and signs: Secondary | ICD-10-CM

## 2024-07-02 DIAGNOSIS — B009 Herpesviral infection, unspecified: Secondary | ICD-10-CM | POA: Diagnosis not present

## 2024-07-02 DIAGNOSIS — E785 Hyperlipidemia, unspecified: Secondary | ICD-10-CM

## 2024-07-02 MED ORDER — VALACYCLOVIR HCL 500 MG PO TABS: 500.0000 mg | ORAL_TABLET | Freq: Two times a day (BID) | ORAL | 2 refills | Status: AC

## 2024-07-02 NOTE — Progress Notes (Signed)
 Patient Office Visit  Assessment & Plan:  Recurrent tonsillitis -     Ambulatory referral to ENT -     STREP GROUP A AG, W/REFLEX TO CULT -     Culture, Group A Strep  Flu-like symptoms  Oral ulcer -     valACYclovir  HCl; Take 1 tablet (500 mg total) by mouth 2 (two) times daily.  Dispense: 60 tablet; Refill: 2  Herpes simplex type 1 infection -     valACYclovir  HCl; Take 1 tablet (500 mg total) by mouth 2 (two) times daily.  Dispense: 60 tablet; Refill: 2  Hyperlipidemia, unspecified hyperlipidemia type   Assessment and Plan    Recurrent pharyngitis with oral ulcerations Recurrent pharyngitis with oral ulcerations. Negative strep culture.  Herpes type 1- recurrent-Prescribed Valtrex  with refills.  Headache Headaches associated with pharyngitis episodes, persistent despite Tylenol  and naproxen  continue medication as needed.   Decreased appetite Decreased appetite due to throat pain and recent illness.  SABRA     ENT consult ordered due to recurrent tonsillitis  Return in about 2 months (around 09/01/2024), or if symptoms worsen or fail to improve, for hyperlipidemia.   Subjective:    Patient ID: Robin Trevino, female    DOB: 09-04-1961  Age: 63 y.o. MRN: [id number]  Chief Complaint  Patient presents with   Medical Management of Chronic Issues    HPI Discussed the use of AI scribe software for clinical note transcription with the patient, who gave verbal consent to proceed.  History of Present Illness        History of Present Illness Robin Trevino is a 63 year old female who presents with recurrent throat infections and persistent sore throat and flu like symptoms.   She experiences recurrent throat infections approximately five to six times per year, with persistent sore throat despite negative strep cultures. Symptoms often begin with ear pain and progress to headaches. patient would like to see ENT re this.   She uses Aleve  and lidocaine  for symptom  relief, but finds lidocaine  difficult to use as it causes gagging and causes nausea. She also has ulcers and a cold sores around her lips. She mistakenly believed she had Valtrex  at home but did not.   She experienced a fever that broke yesterday, and she does not feels achy and as fatigued. She had diarrhea after consuming watermelon, which she attributes to the fruit. Her appetite is almost nonexistent, and she struggles to obtain food due to her inability to drive and lack of support from her son. patient is not having myalgias but having worsening sore throat.   She describes wheezing through the night prior to yesterday and waking up with a white and crusted tongue. She takes Tylenol  for her symptoms, with doses at 11 PM and 11 AM, and occasionally takes an additional dose if her headaches become severe. She also takes Naproxen  which she notes must be taken with food.  Physical Exam HEENT: Throat appears erythematous and inflamed. Right side of oral cavity appears erythematous and inflamed with exudate  Results LABS   Streptococcal culture: Negative from Wednesday, Rapid strep negative today  Assessment and Plan Recurrent pharyngitis with oral ulcerations Recurrent pharyngitis with oral ulcerations. Negative strep culture.  Herpes type 1- recurrent-Prescribed Valtrex  with refills.  Headache due to recent viral illness- Headaches associated with pharyngitis episodes, persistent despite Tylenol  and naproxen  continue medication as needed.   Decreased appetite Decreased appetite due to throat pain and recent illness.  . Hyperlipidemia-patient stopped taking  Crestor  due to negative side effects. Patient is trying beet extract to see if this will help. Patient will return to have labs repeated.    The 10-year ASCVD risk score (Arnett DK, et al., 2019) is: 4.3%  Past Medical History:  Diagnosis Date   Anxiety    Depression    Hyperlipidemia    Mild intermittent asthma 08/29/2015    Past Surgical History:  Procedure Laterality Date   CESAREAN SECTION  11/18/1994   CHOLECYSTECTOMY Bilateral 11/18/1990   DILATION AND CURETTAGE OF UTERUS  1993, 1995   ECTOPIC PREGNANCY SURGERY  11/18/1998   EYE SURGERY  2023   Social History   Tobacco Use   Smoking status: Never   Smokeless tobacco: Never  Substance Use Topics   Alcohol use: Yes    Comment: occ   Drug use: Yes    Types: Marijuana   Family History  Problem Relation Age of Onset   Asthma Mother    Allergic rhinitis Mother    Stroke Mother    CAD Mother    Hypertension Mother    Parkinson's disease Mother    Dementia Mother    Liver cancer Father    Stroke Father    CAD Father    Hypertension Father    Parkinson's disease Sister    Heart attack Sister    Asthma Brother    Colon polyps Brother    Hypertension Brother    Depression Brother    Colon cancer Brother    Asthma Son    Allergies  Allergen Reactions   Almond Oil Shortness Of Breath   Apple Anaphylaxis   Celery Oil Anaphylaxis   Cherry Anaphylaxis   Corylus Shortness Of Breath   Kiwi Extract Anaphylaxis   Pear Anaphylaxis   Pineapple Anaphylaxis   Prunus Persica Anaphylaxis   Amoxicillin  Diarrhea   Donnatal [Belladonna Alk-Phenobarb Er]    Lamotrigine    Penicillins    Phenobarbital-Belladonna Alk    Contrast Media [Iodinated Contrast Media]     Described subjective dyspnea in setting of severe anxiety and have low suspicion for anaphylactic reaction. Improved without epinephrine . Tryptase level sent 10/23.     ROS    Objective:    BP 124/70   Pulse 91   Temp 98.5 F (36.9 C)   Ht 5' 4 (1.626 m)   Wt 146 lb 2 oz (66.3 kg)   LMP 06/01/2014   SpO2 98%   BMI 25.08 kg/m  BP Readings from Last 3 Encounters:  07/02/24 124/70  06/29/24 118/62  05/26/24 128/78   Wt Readings from Last 3 Encounters:  07/02/24 146 lb 2 oz (66.3 kg)  06/29/24 149 lb 8 oz (67.8 kg)  05/26/24 151 lb 3.2 oz (68.6 kg)    Physical  Exam Vitals and nursing note reviewed.  Constitutional:      General: She is not in acute distress.    Appearance: Normal appearance.     Comments: Looks better today compared to Wednesday  HENT:     Head: Normocephalic.     Right Ear: Tympanic membrane, ear canal and external ear normal.     Left Ear: Tympanic membrane, ear canal and external ear normal.     Mouth/Throat:     Mouth: Oral lesions present.     Pharynx: Pharyngeal swelling, oropharyngeal exudate and posterior oropharyngeal erythema present.     Comments: Patient has HSV over lower lips and aphthous ulcers inside lower lip.  Eyes:     Extraocular  Movements: Extraocular movements intact.     Pupils: Pupils are equal, round, and reactive to light.  Cardiovascular:     Rate and Rhythm: Normal rate and regular rhythm.     Heart sounds: Normal heart sounds.  Pulmonary:     Effort: Pulmonary effort is normal.     Breath sounds: Normal breath sounds. No wheezing or rhonchi.  Musculoskeletal:     Right lower leg: No edema.     Left lower leg: No edema.  Neurological:     General: No focal deficit present.     Mental Status: She is alert and oriented to person, place, and time.  Psychiatric:        Mood and Affect: Mood normal.        Behavior: Behavior normal.      Results for orders placed or performed in visit on 07/02/24  STREP GROUP A AG, W/REFLEX TO CULT   Specimen: Throat  Result Value Ref Range   Streptococcus Group A AG NOT DETECTED NOT DETECTED

## 2024-07-05 ENCOUNTER — Ambulatory Visit: Payer: Self-pay | Admitting: Family Medicine

## 2024-07-05 LAB — STREP GROUP A AG, W/REFLEX TO CULT: Streptococcus Group A AG: NOT DETECTED

## 2024-07-05 LAB — CULTURE, GROUP A STREP
Micro Number: 16837987
SPECIMEN QUALITY:: ADEQUATE

## 2024-07-06 ENCOUNTER — Other Ambulatory Visit: Payer: Self-pay

## 2024-07-06 MED ORDER — PREDNISONE 20 MG PO TABS: 20.0000 mg | ORAL_TABLET | Freq: Every day | ORAL | 0 refills | Status: AC

## 2024-07-08 ENCOUNTER — Other Ambulatory Visit: Payer: Self-pay

## 2024-08-06 ENCOUNTER — Ambulatory Visit (INDEPENDENT_AMBULATORY_CARE_PROVIDER_SITE_OTHER)

## 2024-08-06 VITALS — BP 111/62 | HR 73 | Temp 97.6°F | Ht 64.0 in | Wt 150.0 lb

## 2024-08-06 DIAGNOSIS — G4733 Obstructive sleep apnea (adult) (pediatric): Secondary | ICD-10-CM | POA: Diagnosis not present

## 2024-08-06 DIAGNOSIS — R490 Dysphonia: Secondary | ICD-10-CM

## 2024-08-06 DIAGNOSIS — J358 Other chronic diseases of tonsils and adenoids: Secondary | ICD-10-CM | POA: Diagnosis not present

## 2024-08-06 DIAGNOSIS — G4710 Hypersomnia, unspecified: Secondary | ICD-10-CM

## 2024-08-06 DIAGNOSIS — K219 Gastro-esophageal reflux disease without esophagitis: Secondary | ICD-10-CM

## 2024-08-06 MED ORDER — OMEPRAZOLE 20 MG PO CPDR: 20.0000 mg | DELAYED_RELEASE_CAPSULE | Freq: Two times a day (BID) | ORAL | 3 refills | Status: DC

## 2024-08-06 MED ORDER — CHLORHEXIDINE GLUCONATE 0.12 % MT SOLN: 15.0000 mL | Freq: Two times a day (BID) | OROMUCOSAL | 0 refills | Status: AC

## 2024-08-06 NOTE — Progress Notes (Signed)
 Dear Dr. Aletha, Here is my assessment for our mutual patient, Robin Trevino. Thank you for allowing me the opportunity to care for your patient. Please do not hesitate to contact me should you have any other questions. Sincerely, Dr. Penne Croak  Otolaryngology Clinic Note Referring provider: Dr. Aletha HPI:  Robin Trevino is a 63 y.o. female kindly referred by Dr. Aletha   Reason for Consult The patient presented with persistent throat pain and voice changes, initially starting in January, with symptoms recurring over several months.  History of Present Illness The patient reports a persistent sore throat with symptoms starting on the left side, including swelling, ear pain, headaches, and mucus. Symptoms have recurred multiple times since January, each lasting two to three weeks. The patient has experienced laryngitis and was previously treated with numbing gel and prednisone . Prednisone  provided some relief. The patient denies taking antibiotics and mentions acid reflux, although no medication is taken for it. Tonsil stones are present, during these 2 weeks of pain. She notes reflux symptoms and nightly cough. She also will wake herself up choking. The patient has seasonal allergies and experiences a chronic nighttime cough potentially related to secondhand smoke exposure.  The STOP-BANG questionnaire items used to screen for obstructive sleep apnea are: 1. Snoring: Do you snore loudly (loud enough to be heard through closed doors)? Yes  2. Tiredness: Do you often feel tired, fatigued, or sleepy during the daytime? Yes  3. Observed apnea: Has anyone observed you stop breathing during your sleep? yes  4. Blood pressure: Do you have or are you being treated for high blood pressure? No  5. Body mass index (BMI): Is your BMI greater than 35 kg/m? No  6. Age: Are you older than 50 years? Yes  7. Neck circumference: Is your neck circumference greater than 40 cm?  8.  Gender: Are you female?  4/8 Each item is answered as yes or no, and the total score (0-8) is used to stratify risk for obstructive sleep apnea. This tool is widely validated and recommended for screening in clinical and perioperative settings, as supported by the American Heart Association and multiple large studies.  The Epworth Sleepiness Scale questionnaire consists of eight questions that ask about the likelihood of dozing off or falling asleep in the following situations: 1. Sitting and reading 1 2. Watching TV 1 3. Sitting inactive in a public place (e.g., a theater or meeting) 0 4. As a passenger in a car for an hour without a break 0 5. Lying down to rest in the afternoon when circumstances permit 1 6. Sitting and talking to someone 0 7. Sitting quietly after a lunch without alcohol 0 8. In a car, while stopped for a few minutes in traffic 0  Total: 3 Each situation is rated on a scale from 0 (would never doze) to 3 (high chance of dozing), and the total score ranges from 0 to 24, with higher scores indicating greater daytime sleepiness  Independent Review of Additional Tests or Records:    PMH/Meds/All/SocHx/FamHx/ROS:   Past Medical History:  Diagnosis Date   Anxiety    Depression    Hyperlipidemia    Mild intermittent asthma 08/29/2015     Past Surgical History:  Procedure Laterality Date   CESAREAN SECTION  11/18/1994   CHOLECYSTECTOMY Bilateral 11/18/1990   DILATION AND CURETTAGE OF UTERUS  1993, 1995   ECTOPIC PREGNANCY SURGERY  11/18/1998   EYE SURGERY  2023    Family History  Problem Relation Age  of Onset   Asthma Mother    Allergic rhinitis Mother    Stroke Mother    CAD Mother    Hypertension Mother    Parkinson's disease Mother    Dementia Mother    Liver cancer Father    Stroke Father    CAD Father    Hypertension Father    Parkinson's disease Sister    Heart attack Sister    Asthma Brother    Colon polyps Brother    Hypertension  Brother    Depression Brother    Colon cancer Brother    Asthma Son      Social Connections: Not on file      Current Outpatient Medications:    Albuterol  Sulfate (PROAIR  RESPICLICK) 108 (90 BASE) MCG/ACT AEPB, Inhale 2 puffs into the lungs every 6 (six) hours., Disp: 1 each, Rfl: 0   busPIRone (BUSPAR) 10 MG tablet, Take 20 mg by mouth 2 (two) times daily., Disp: , Rfl:    chlorhexidine  (PERIDEX ) 0.12 % solution, Use as directed 15 mLs in the mouth or throat 2 (two) times daily for 7 days., Disp: 210 mL, Rfl: 0   citalopram (CELEXA) 40 MG tablet, Take 40 mg by mouth daily., Disp: , Rfl:    cyanocobalamin (VITAMIN B12) 1000 MCG tablet, Take 1,000 mcg by mouth daily., Disp: , Rfl:    EPINEPHrine  (EPIPEN  2-PAK) 0.3 mg/0.3 mL IJ SOAJ injection, Inject 0.3 mLs (0.3 mg total) into the muscle once., Disp: 2 Device, Rfl: 1   MAGNESIUM  GLYCINATE PO, Take 400 mg by mouth daily., Disp: , Rfl:    omeprazole  (PRILOSEC) 20 MG capsule, Take 1 capsule (20 mg total) by mouth 2 (two) times daily before a meal., Disp: 60 capsule, Rfl: 3   OVER THE COUNTER MEDICATION, Nutrafol- OTC, Disp: , Rfl:    valACYclovir  (VALTREX ) 500 MG tablet, Take 1 tablet (500 mg total) by mouth 2 (two) times daily., Disp: 60 tablet, Rfl: 2   VITAMIN D  PO, Take 1,000 Int'l Units/day by mouth daily., Disp: , Rfl:    fluticasone (FLONASE) 50 MCG/ACT nasal spray, Place 1 spray into both nostrils daily. (Patient not taking: Reported on 08/06/2024), Disp: , Rfl:    lidocaine  (XYLOCAINE ) 2 % solution, Swallow 5mL every 4-6 hours as needed for sore throat (Patient not taking: Reported on 08/06/2024), Disp: 100 mL, Rfl: 0   naproxen  (NAPROSYN ) 500 MG tablet, Take 1 tablet (500 mg total) by mouth 2 (two) times daily with a meal. (Patient not taking: Reported on 08/06/2024), Disp: 30 tablet, Rfl: 0   rosuvastatin  (CRESTOR ) 10 MG tablet, Take 1 tablet (10 mg total) by mouth daily. (Patient not taking: Reported on 08/06/2024), Disp: 90 tablet, Rfl:  1   Physical Exam:   BP 111/62 (BP Location: Right Arm, Patient Position: Sitting)   Pulse 73   Temp 97.6 F (36.4 C)   Ht 5' 4 (1.626 m)   Wt 150 lb (68 kg)   LMP 06/01/2014   SpO2 96%   BMI 25.75 kg/m   The patient was awake, alert, and appropriate. The external ears were inspected, and otoscopy was performed to evaluate the external auditory canals and tympanic membranes. The nasal cavity and septum were examined for mucosal changes, obstruction, or discharge. The oral cavity and oropharynx were inspected for mucosal lesions, infection, or tonsillar hypertrophy. The neck was palpated for lymphadenopathy, thyroid  abnormalities, or other masses. Cranial nerve function was grossly intact.  Pertinent Findings:  Seprately Identifiable Procedures:  I  personally ordered, reviewed and interpreted the following with the patient today  Procedure Note Pre-procedure diagnosis:  Dysphonia  Post-procedure diagnosis: Same Procedure: Transnasal Fiberoptic Laryngoscopy, CPT 31575 - Mod 25 Indication: throat pain and dysphonia Complications: None apparent EBL: 0 mL  The procedure was undertaken to further evaluate the patient's complaint of dysphonia and pain, with mirror exam inadequate for appropriate examination due to gag reflex and poor patient tolerance  Procedure:  Patient was identified as correct patient. Verbal consent was obtained. The nose was sprayed with oxymetazoline and 4% lidocaine . The The flexible laryngoscope was passed through the nose to view the nasal cavity, pharynx (oropharynx, hypopharynx) and larynx.  The larynx was examined at rest and during multiple phonatory tasks. Documentation was obtained and reviewed with patient. The scope was removed. The patient tolerated the procedure well.  Findings: The nasal cavity and nasopharynx did not reveal any masses or lesions, mucosa appeared to be without obvious lesions. The tongue base, pharyngeal walls, piriform sinuses,  vallecula, epiglottis and postcricoid region are normal in appearance EXCEPT: interarytenoid erythema and edema. The visualized portion of the subglottis and proximal trachea is widely patent. The vocal folds are mobile bilaterally. There are no lesions on the free edge of the vocal folds nor elsewhere in the larynx worrisome for malignancy.    Electronically signed by: Penne Croak, DO 08/06/2024 4:51 PM        Impression & Plans:  Ladaisha Portillo is a 63 y.o. female with multiple complaints. She has had recurrent throat infections coinciding with tonsil stones that last 2 weeks. She has severely exacerbated daytime somnolence and waking up gasping in the night. She also reports acid reflux.   1. Tonsillolith   2. Hypersomnolence   3. OSA (obstructive sleep apnea)   4. Laryngopharyngeal reflux (LPR)     Plan - Findings and diagnoses discussed in detail with the patient. - Risks, benefits, and alternatives were reviewed. Through shared decision making, the patient elects to proceed with medical therapy below, PSG, water pick on a low setting, and close follow up. - Insurance underwriter provided to the patient. - Follow up: 8 weeks. Patient instructed to return sooner, call the office or message me with an update. She is to go to the ED if new/worsening symptoms develop. - Orders Placed This Encounter  Procedures   Nocturnal polysomnography (NPSG)    - See below regarding exact medications prescribed this encounter including dosages and route: Meds ordered this encounter  Medications   chlorhexidine  (PERIDEX ) 0.12 % solution    Sig: Use as directed 15 mLs in the mouth or throat 2 (two) times daily for 7 days.    Dispense:  210 mL    Refill:  0   omeprazole  (PRILOSEC) 20 MG capsule    Sig: Take 1 capsule (20 mg total) by mouth 2 (two) times daily before a meal.    Dispense:  60 capsule    Refill:  3    Thank you for allowing me the opportunity to care for your patient. Please  do not hesitate to contact me should you have any other questions.  Sincerely, Penne Croak, DO Otolaryngologist (ENT) [facility] Phone: [phone] Fax: [phone]  08/06/2024, 4:51 PM

## 2024-08-06 NOTE — Patient Instructions (Addendum)
 Use chlorhexidine  PRN for tonsil stones.  Use a water pik on lowest setting

## 2024-08-09 ENCOUNTER — Encounter (INDEPENDENT_AMBULATORY_CARE_PROVIDER_SITE_OTHER): Payer: Self-pay

## 2024-08-09 ENCOUNTER — Other Ambulatory Visit: Payer: Self-pay | Admitting: Medical Genetics

## 2024-08-26 ENCOUNTER — Other Ambulatory Visit: Payer: Self-pay

## 2024-08-26 DIAGNOSIS — Z006 Encounter for examination for normal comparison and control in clinical research program: Secondary | ICD-10-CM

## 2024-09-04 LAB — GENECONNECT MOLECULAR SCREEN: Genetic Analysis Overall Interpretation: NEGATIVE

## 2024-09-17 ENCOUNTER — Ambulatory Visit: Admitting: Family Medicine

## 2024-10-05 ENCOUNTER — Ambulatory Visit (INDEPENDENT_AMBULATORY_CARE_PROVIDER_SITE_OTHER)

## 2024-11-08 ENCOUNTER — Ambulatory Visit: Payer: Self-pay

## 2024-11-08 NOTE — Telephone Encounter (Signed)
 FYI Only or Action Required?: FYI only for provider: appointment scheduled on 12/23.  Patient was last seen in primary care on 07/02/2024 by Aletha Bene, MD.  Called Nurse Triage reporting Dysuria and Hemorrhoids (/).  Symptoms began a week ago.  Interventions attempted: OTC medications: Preparation H, Witch Hazel, Sitz Bath, Hydrocortisone , Tucks and Prescription medications: Linzess.  Symptoms are: gradually worsening.  Triage Disposition: See Physician Within 24 Hours  Patient/caregiver understands and will follow disposition?: Yes    Onset cloudy urine and 5/10 burning with urination 1 week ago. Denies urine retention, fever, blood in urine or flank pain. Also reports have hx of hemorrhoids with one the size of a thumb popping out/protruding since Thursday. No bleeding. Hx of having hemorrhoid removed in the same place several years ago, was told they may return. Takes Linzess. Has hx of colitis. Reports severe pain but is still able to have a BM. Very itching. Scheduled appt with PCP tomorrow, advised UC or ED for worsening symptoms.     Copied from CRM #[id number]. Topic: Clinical - Red Word Triage >> Nov 08, 2024 11:41 AM Jeoffrey H wrote: [facility] that prompted transfer to Nurse Triage: Patient stated that she has a history of hemorrhoids and they came back Monday. They are causing some pain. She also believes she had a UTI due to burning sensation, cloudy pee, and urinary urgency. No longer has insurance and wanted to self pay- I did provide phone number to billing due to her wanting to know how much an office visit would cost. Reason for Disposition  Age > 50 years  MODERATE-SEVERE rectal pain (i.e., interferes with school, work, or sleep)  Answer Assessment - Initial Assessment Questions 1. SEVERITY: How bad is the pain?  (e.g., Scale 1-10; mild, moderate, or severe)     4-5/10 2. FREQUENCY: How many times have you had painful urination today?      Frequency 3. PATTERN:  Is pain present every time you urinate or just sometimes?      Every time 4. ONSET: When did the painful urination start?      1 week ago 5. FEVER: Do you have a fever? If Yes, ask: What is your temperature, how was it measured, and when did it start?     Denies 6. PAST UTI: Have you had a urine infection before? If Yes, ask: When was the last time? and What happened that time?      Yes, had same symptoms, had to take abxs 7. CAUSE: What do you think is causing the painful urination?  (e.g., UTI, scratch, Herpes sore)     UTI 8. OTHER SYMPTOMS: Do you have any other symptoms? (e.g., blood in urine, flank pain, genital sores, urgency, vaginal discharge)     Cloudy urine, denies flank pain  Answer Assessment - Initial Assessment Questions 1. SYMPTOM:  What's the main symptom you're concerned about? (e.g., pain, itching, swelling, rash)     Hemorrhoid popping out, the size of a thumb  2. ONSET: When did the Hemorrhoid popping out  start?     Thursday  3. RECTAL PAIN: Do you have any pain around your rectum? How bad is the pain?  (Scale 0-10; or none, mild, moderate, severe)     10/10  4. RECTAL ITCHING: Do you have any itching in this area? How bad is the itching?  (Scale 0-10; or none, mild, moderate, severe)     Yes, severe  5. CONSTIPATION: Do you have constipation? If Yes,  ask: How often do you have a bowel movement (BM)?  (Normal range: 3 times a day to every 3 days)  When was your last BM?       Denies  6. CAUSE: What do you think is causing the anus symptoms?     Hemorrhoid. Had a protruding Hemorrhoid in the past that had to be surgically removed. Was told it may return.  7. OTHER SYMPTOMS: Do you have any other symptoms?  (e.g., abdomen pain, fever, rectal bleeding, vomiting)     Denies  Protocols used: Urination Pain - Female-A-AH, Rectal Symptoms-A-AH

## 2024-11-09 ENCOUNTER — Encounter: Payer: Self-pay | Admitting: Family Medicine

## 2024-11-09 ENCOUNTER — Ambulatory Visit: Payer: Self-pay | Admitting: Family Medicine

## 2024-11-09 VITALS — BP 116/78 | HR 72 | Ht 64.0 in | Wt 150.0 lb

## 2024-11-09 DIAGNOSIS — K219 Gastro-esophageal reflux disease without esophagitis: Secondary | ICD-10-CM

## 2024-11-09 DIAGNOSIS — K5900 Constipation, unspecified: Secondary | ICD-10-CM

## 2024-11-09 DIAGNOSIS — R300 Dysuria: Secondary | ICD-10-CM

## 2024-11-09 DIAGNOSIS — K644 Residual hemorrhoidal skin tags: Secondary | ICD-10-CM

## 2024-11-09 LAB — URINALYSIS, ROUTINE W REFLEX MICROSCOPIC
Bilirubin Urine: NEGATIVE
Casts: NONE SEEN /LPF
Crystals: NONE SEEN " /HPF"
Glucose, UA: NEGATIVE
Ketones, ur: NEGATIVE
Nitrite: NEGATIVE
Specific Gravity, Urine: 1.015 (ref 1.001–1.035)
Yeast: NONE SEEN /HPF
pH: 6 (ref 5.0–8.0)

## 2024-11-09 LAB — MICROSCOPIC MESSAGE

## 2024-11-09 MED ORDER — HYDROCORTISONE ACETATE 25 MG RE SUPP: 25.0000 mg | Freq: Two times a day (BID) | RECTAL | 1 refills | Status: AC

## 2024-11-09 MED ORDER — HYDROCORTISONE (PERIANAL) 2.5 % EX CREA: 1.0000 | TOPICAL_CREAM | Freq: Two times a day (BID) | CUTANEOUS | 1 refills | Status: AC

## 2024-11-09 MED ORDER — OMEPRAZOLE 20 MG PO CPDR: 20.0000 mg | DELAYED_RELEASE_CAPSULE | Freq: Two times a day (BID) | ORAL | 1 refills | Status: AC

## 2024-11-09 MED ORDER — NITROFURANTOIN MONOHYD MACRO 100 MG PO CAPS: 100.0000 mg | ORAL_CAPSULE | Freq: Two times a day (BID) | ORAL | 0 refills | Status: AC

## 2024-11-09 NOTE — Progress Notes (Signed)
 "  Patient Office Visit  Assessment & Plan:  Dysuria -     Urinalysis, Routine w reflex microscopic -     Urine Culture -     Nitrofurantoin  Monohyd Macro; Take 1 capsule (100 mg total) by mouth 2 (two) times daily for 7 days.  Dispense: 14 capsule; Refill: 0  Constipation, unspecified constipation type  Gastroesophageal reflux disease without esophagitis -     Omeprazole ; Take 1 capsule (20 mg total) by mouth 2 (two) times daily before a meal.  Dispense: 60 capsule; Refill: 1  External hemorrhoid -     Hydrocortisone  (Perianal); Place 1 Application rectally 2 (two) times daily.  Dispense: 30 g; Refill: 1 -     Hydrocortisone  Acetate; Place 1 suppository (25 mg total) rectally 2 (two) times daily.  Dispense: 12 suppository; Refill: 1  Other orders -     Microscopic Message   Assessment and Plan    Acute urinary tract infection Urinalysis confirmed infection with bacteria and microscopic hematuria. Considered possible sexual transmission. - Sent urine for culture. - Prescribed Macrobid . - Discussed Pyridium for dysuria but patient declined.   External hemorrhoid Significant pain and minimal bleeding. Decreased in size with OTC treatment. - Prescribed Anusol  cream and suppositories. Continue high fiber diet  Chronic constipation Managed with Linzess. Variable bowel movements noted. Constipation linked to hemorrhoid exacerbation. - Continue Linzess. - Encouraged dietary fiber intake.  Gastroesophageal reflux disease Improvement with once-daily omeprazole  despite twice-daily prescription. Symptoms include nocturnal burning. - Continue omeprazole  once daily.     Fiber information given and reviewed.  IBS information given to the patient.  Continue high-fiber diet.  If no improvement or worsening symptoms she is to notify us .  Follow-up on urine culture results and notify patient.  Macrobid  sent to the pharmacy.     No follow-ups on file.   Subjective:    Patient ID:  Robin Trevino, female    DOB: 10/01/1961  Age: 62 y.o. MRN: [id number]  Chief Complaint  Patient presents with   Hemorrhoids    Pain started on Thursday.   Dysuria    Urinary pain started 1 week ago.     Dysuria    Discussed the use of AI scribe software for clinical note transcription with the patient, who gave verbal consent to proceed.  History of Present Illness        History of Present Illness Robin Trevino is a 63 year old female who presents with urinary tract symptoms and external hemorrhoids.  She has been experiencing urinary tract symptoms for over a week, including dysuria and urinary frequency. No fever or chills are present, but she notes cloudy urine over the weekend. She has been consuming cranberry products in an attempt to alleviate the symptoms. She believes the symptoms may be related to recent sexual activity, which was painful due to vaginal dryness.  She has been dealing with external hemorrhoids since Thursday, describing them as large and painful with minimal bleeding. The hemorrhoids were initially as large as her thumb. She experiences frequent bowel movements due to Linzess, which she takes for constipation, but reports variability in its effectiveness. She mentions a history of hemorrhoid removal during a previous colonoscopy.  Her medication regimen includes Linzess and omeprazole , which she takes once daily despite the prescription indicating twice daily. She ran out of omeprazole , which was prescribed by an ENT for silent acid reflux that caused throat symptoms. She drinks six to eight glasses of water daily and maintains a diet  with vegetables and oatmeal, supplemented with flax seed, chia, and hemp protein. She experiences frequent gas and a sensation of fullness, leading to a reduced appetite and eating only two meals a day.  She lost her insurance, which affected her ability to undergo a scheduled colonoscopy. She receives Social Security Spousal  Survivorship benefits, which disqualify her from certain financial assistance programs.  Physical Exam GENITOURINARY: Urine analysis reveals presence of bacteria and microscopic hematuria. RECTAL: External hemorrhoid present, reduced in size.  Results Labs UA (11/09/2024): Bacteriuria and microscopic hematuria  Assessment and Plan Acute urinary tract infection Urinalysis confirmed infection with bacteria and microscopic hematuria. Considered possible sexual transmission. - Sent urine for culture. - Prescribed Macrobid . - Discussed Pyridium for dysuria but patient declined.   External hemorrhoid Significant pain and minimal bleeding. Decreased in size with OTC treatment. - Prescribed Anusol  cream and suppositories. Continue high fiber diet  Chronic constipation Managed with Linzess. Variable bowel movements noted. Constipation linked to hemorrhoid exacerbation. - Continue Linzess. - Encouraged dietary fiber intake.  Gastroesophageal reflux disease Improvement with once-daily omeprazole  despite twice-daily prescription. Symptoms include nocturnal burning. - Continue omeprazole  once daily.    The 10-year ASCVD risk score (Arnett DK, et al., 2019) is: 4.1%  Past Medical History:  Diagnosis Date   Anxiety    Depression    Hyperlipidemia    Mild intermittent asthma 08/29/2015   Past Surgical History:  Procedure Laterality Date   CESAREAN SECTION  11/18/1994   CHOLECYSTECTOMY Bilateral 11/18/1990   DILATION AND CURETTAGE OF UTERUS  1993, 1995   ECTOPIC PREGNANCY SURGERY  11/18/1998   EYE SURGERY  2023   Social History[1] Family History  Problem Relation Age of Onset   Asthma Mother    Allergic rhinitis Mother    Stroke Mother    CAD Mother    Hypertension Mother    Parkinson's disease Mother    Dementia Mother    Liver cancer Father    Stroke Father    CAD Father    Hypertension Father    Parkinson's disease Sister    Heart attack Sister    Asthma Brother     Colon polyps Brother    Hypertension Brother    Depression Brother    Colon cancer Brother    Asthma Son    Allergies[2]  Review of Systems  Genitourinary:  Positive for dysuria.      Objective:    BP 116/78   Pulse 72   Ht 5' 4 (1.626 m)   Wt 150 lb (68 kg)   LMP 06/01/2014   SpO2 98%   BMI 25.75 kg/m  BP Readings from Last 3 Encounters:  11/09/24 116/78  08/06/24 111/62  07/02/24 124/70   Wt Readings from Last 3 Encounters:  11/09/24 150 lb (68 kg)  08/06/24 150 lb (68 kg)  07/02/24 146 lb 2 oz (66.3 kg)    Physical Exam Vitals and nursing note reviewed.  Constitutional:      General: She is not in acute distress.    Appearance: Normal appearance.  HENT:     Head: Normocephalic.     Right Ear: Tympanic membrane, ear canal and external ear normal.     Left Ear: Tympanic membrane, ear canal and external ear normal.  Eyes:     Extraocular Movements: Extraocular movements intact.     Conjunctiva/sclera: Conjunctivae normal.     Pupils: Pupils are equal, round, and reactive to light.  Cardiovascular:     Rate and Rhythm: Normal  rate and regular rhythm.     Heart sounds: Normal heart sounds.  Pulmonary:     Effort: Pulmonary effort is normal.     Breath sounds: Normal breath sounds.  Abdominal:     General: Bowel sounds are normal.     Tenderness: There is no abdominal tenderness. There is no right CVA tenderness, left CVA tenderness or guarding.     Hernia: No hernia is present.  Genitourinary:    Rectum: No external hemorrhoid.     Comments: Large external hemorrhoid, grossly no blood seen.  Musculoskeletal:     Right lower leg: No edema.     Left lower leg: No edema.  Neurological:     General: No focal deficit present.     Mental Status: She is alert and oriented to person, place, and time.  Psychiatric:        Mood and Affect: Mood normal.        Behavior: Behavior normal.      Results for orders placed or performed in visit on 11/09/24   Urinalysis, Routine w reflex microscopic  Result Value Ref Range   Color, Urine YELLOW YELLOW   APPearance CLOUDY (A) CLEAR   Specific Gravity, Urine 1.015 1.001 - 1.035   pH 6.0 5.0 - 8.0   Glucose, UA NEGATIVE NEGATIVE   Bilirubin Urine NEGATIVE NEGATIVE   Ketones, ur NEGATIVE NEGATIVE   Hgb urine dipstick 1+ (A) NEGATIVE   Protein, ur 1+ (A) NEGATIVE   Nitrite NEGATIVE NEGATIVE   Leukocytes,Ua 2+ (A) NEGATIVE   WBC, UA 40-60 (A) 0 - 5 /HPF   RBC / HPF 3-10 (A) 0 - 2 /HPF   Squamous Epithelial / HPF 0-5 < OR = 5 /HPF   Bacteria, UA MODERATE (A) NONE SEEN /HPF   Crystals NONE SEEN NONE SEEN  /HPF   Casts NONE SEEN NONE SEEN /LPF   Yeast NONE SEEN NONE SEEN /HPF  Microscopic Message  Result Value Ref Range   Note              [1]  Social History Tobacco Use   Smoking status: Never   Smokeless tobacco: Never  Substance Use Topics   Alcohol use: Yes    Comment: occ   Drug use: Yes    Types: Marijuana  [2]  Allergies Allergen Reactions   Almond Oil Shortness Of Breath   Apple Anaphylaxis   Celery Oil Anaphylaxis   Cherry Anaphylaxis   Corylus Shortness Of Breath   Kiwi Extract Anaphylaxis   Pear Anaphylaxis   Pineapple Anaphylaxis   Prunus Persica Anaphylaxis   Amoxicillin  Diarrhea   Donnatal [Belladonna Alk-Phenobarb Er]    Lamotrigine    Penicillins    Phenobarbital-Belladonna Alk    Contrast Media [Iodinated Contrast Media]     Described subjective dyspnea in setting of severe anxiety and have low suspicion for anaphylactic reaction. Improved without epinephrine . Tryptase level sent 10/23.    "

## 2024-11-12 ENCOUNTER — Ambulatory Visit: Payer: Self-pay | Admitting: Family Medicine

## 2024-11-12 LAB — URINE CULTURE
MICRO NUMBER:: 17391744
SPECIMEN QUALITY:: ADEQUATE
